# Patient Record
Sex: Female | Born: 1950 | Race: White | Hispanic: No | Marital: Single | Smoking: Current every day smoker
Health system: Southern US, Community
[De-identification: ages and names within clinical notes are randomized; demographics above are authoritative.]

## PROBLEM LIST (undated history)

## (undated) DIAGNOSIS — I4729 Other ventricular tachycardia: Secondary | ICD-10-CM

## (undated) DIAGNOSIS — I2510 Atherosclerotic heart disease of native coronary artery without angina pectoris: Secondary | ICD-10-CM

## (undated) DIAGNOSIS — I82409 Acute embolism and thrombosis of unspecified deep veins of unspecified lower extremity: Secondary | ICD-10-CM

## (undated) HISTORY — PX: ABDOMINAL HYSTERECTOMY: SHX81

## (undated) HISTORY — DX: Other ventricular tachycardia: I47.29

## (undated) HISTORY — PX: CARDIAC CATHETERIZATION: SHX172

## (undated) HISTORY — DX: Atherosclerotic heart disease of native coronary artery without angina pectoris: I25.10

---
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
## 1999-05-14 ENCOUNTER — Other Ambulatory Visit: Admission: RE | Admit: 1999-05-14 | Discharge: 1999-05-14 | Payer: Self-pay | Admitting: Gynecology

## 2002-07-04 ENCOUNTER — Encounter: Payer: Self-pay | Admitting: Emergency Medicine

## 2002-07-04 ENCOUNTER — Emergency Department (HOSPITAL_COMMUNITY): Admission: EM | Admit: 2002-07-04 | Discharge: 2002-07-04 | Payer: Self-pay | Admitting: Emergency Medicine

## 2010-07-28 ENCOUNTER — Ambulatory Visit: Payer: Self-pay | Admitting: Internal Medicine

## 2012-03-09 ENCOUNTER — Encounter (HOSPITAL_COMMUNITY): Payer: Self-pay | Admitting: Emergency Medicine

## 2012-03-09 ENCOUNTER — Emergency Department (HOSPITAL_COMMUNITY): Payer: Self-pay

## 2012-03-09 ENCOUNTER — Emergency Department (HOSPITAL_COMMUNITY)
Admission: EM | Admit: 2012-03-09 | Discharge: 2012-03-09 | Disposition: A | Discharge status: Home / Self Care | Payer: Self-pay | Attending: Emergency Medicine | Admitting: Emergency Medicine

## 2012-03-09 DIAGNOSIS — S82209A Unspecified fracture of shaft of unspecified tibia, initial encounter for closed fracture: Secondary | ICD-10-CM | POA: Insufficient documentation

## 2012-03-09 DIAGNOSIS — Y929 Unspecified place or not applicable: Secondary | ICD-10-CM | POA: Insufficient documentation

## 2012-03-09 DIAGNOSIS — W64XXXA Exposure to other animate mechanical forces, initial encounter: Secondary | ICD-10-CM | POA: Insufficient documentation

## 2012-03-09 DIAGNOSIS — Y939 Activity, unspecified: Secondary | ICD-10-CM | POA: Insufficient documentation

## 2012-03-09 NOTE — ED Notes (Signed)
PT. REPORTS LEFT KNEE PAIN ONSET THIS AFTERNOON , STATES HIT BY A DOG THIS AFTERNOON , PAIN WITH MOVEMENT , AMBULATORY.

## 2012-03-09 NOTE — ED Notes (Signed)
Pt states a dog ran into her and hurt her left knee. Pt sates she lowered herself to ground as "it hurt so bad." NAd noted. Pt ambulatory to room with quick steady gait. Pain moves around lower leg, from front of knee, to back of knee, to shin and then to calf. No deformity or swelling noted.

## 2012-03-09 NOTE — Progress Notes (Signed)
Orthopedic Tech Progress Note Patient Details:  Leslie Cross [birth year] [id number]  Ortho Devices Type of Ortho Device: Crutches;Knee Immobilizer Ortho Device/Splint Location: (L) LE Ortho Device/Splint Interventions: Application   Jennye Moccasin 03/09/2012, 10:09 PM

## 2012-03-09 NOTE — ED Provider Notes (Signed)
History   This chart was scribed for non-physician practitioner working with Richardean Canal, MD by Bennett Scrape, ED Scribe. This patient was seen in room TR10C/TR10C and the patient's care was started at 21:33.  CSN: [id number]  Arrival date & time 03/09/12  1919   First MD Initiated Contact with Patient 03/09/12 2037      Chief Complaint  Patient presents with  . Knee Pain    HPI  Leslie Cross is a 62 y.o. female who presents to the Emergency Department complaining of 3 hours of new, constant, gradually improving, left knee pain after injury. Pain is 3/10, worse with ambulation, alleviated with elevation. Pt reports being hit in the left knee by a large dog. Ambulatory after event. No fall, LOC, cephalic injury, hip pain, or back pain. She states she has mild relief after taking 3 ibuprofen. Pt denies use of tobacco products, consumption of alcohol, and use of illicit drugs. No pertinent medical or surgical Hx denoted.    History reviewed. No pertinent past medical history.  History reviewed. No pertinent past surgical history.  No family history on file.  History  Substance Use Topics  . Smoking status: Never Smoker   . Smokeless tobacco: Not on file  . Alcohol Use: No    Review of Systems  Musculoskeletal: Negative for back pain and gait problem.  Skin: Negative for wound.  Neurological: Negative for headaches.  All other systems reviewed and are negative.    Allergies  Cephalexin; Erythromycin base; Other; and Penicillins  Home Medications   Current Outpatient Rx  Name  Route  Sig  Dispense  Refill  . IBU PO   Oral   Take 3 tablets by mouth once.           BP 128/92  Pulse 84  Temp 98.1 F (36.7 C) (Oral)  Resp 14  SpO2 99%  Physical Exam  Nursing note and vitals reviewed. Constitutional: She is oriented to person, place, and time. She appears well-developed and well-nourished. No distress.  HENT:  Head: Normocephalic and atraumatic.  Eyes:  Conjunctivae normal and EOM are normal.  Neck: Normal range of motion. Neck supple. No tracheal deviation present.  Cardiovascular: Normal rate.   Pulmonary/Chest: Effort normal. No respiratory distress.  Abdominal: She exhibits no distension.  Musculoskeletal: Normal range of motion.       Left knee joint line tender to palpation, ROM deferred 2/2 pain, mild knee swelling.  Neurological: She is alert and oriented to person, place, and time. No sensory deficit.  Skin: Skin is dry.  Psychiatric: She has a normal mood and affect. Her behavior is normal. Judgment and thought content normal.    ED Course  Procedures DIAGNOSTIC STUDIES: Oxygen Saturation is 99% on room air, normal by my interpretation.    COORDINATION OF CARE: 21:33- Evaluated Pt. Pt is awake, alert, and without distress. 21:39- Patient  understand and agree with initial ED impression and plan with expectations set for ED visit.     Labs Reviewed - No data to display Dg Knee Complete 4 Views Left  03/09/2012  *RADIOLOGY REPORT*  Clinical Data: Blow to the lateral aspect of the knee.  Pain.  LEFT KNEE - COMPLETE 4+ VIEW  Comparison: None.  Findings: The patient has a minimal depressed lateral tibial plateau fracture.  No other acute bony or joint abnormality is identified.  Small joint effusion is noted.  IMPRESSION: Minimally depressed lateral tibial plateau fracture.   Original Report Authenticated By: Maisie Fus  Maricela Curet, M.D.      1. Tibia fracture       MDM  62 year old female with the tibia fracture. Will place the patient in a long-leg splint, give the patient crutches, and have her followup with orthopedics. The patient does not want any strong pain medicine. Encouraged patient to continue taking ibuprofen and to use ice on the affected area. Encouraged patient to use rice therapy. Will give the patient a work note.     I personally performed the services described in this documentation, which was scribed in  my presence. The recorded information has been reviewed and is accurate.     Roxy Horseman, PA-C 03/09/12 2147

## 2012-03-09 NOTE — ED Provider Notes (Signed)
Medical screening examination/treatment/procedure(s) were performed by non-physician practitioner and as supervising physician I was immediately available for consultation/collaboration.   David H Yao, MD 03/09/12 2350 

## 2012-03-13 ENCOUNTER — Other Ambulatory Visit: Payer: Self-pay | Admitting: Sports Medicine

## 2012-03-13 ENCOUNTER — Ambulatory Visit
Admission: RE | Admit: 2012-03-13 | Discharge: 2012-03-13 | Disposition: A | Discharge status: Home / Self Care | Payer: No Typology Code available for payment source | Source: Ambulatory Visit | Attending: Sports Medicine | Admitting: Sports Medicine

## 2012-03-13 DIAGNOSIS — S82143A Displaced bicondylar fracture of unspecified tibia, initial encounter for closed fracture: Secondary | ICD-10-CM

## 2018-08-25 ENCOUNTER — Emergency Department (HOSPITAL_COMMUNITY): Payer: Self-pay

## 2018-08-25 ENCOUNTER — Other Ambulatory Visit: Payer: Self-pay

## 2018-08-25 ENCOUNTER — Observation Stay (HOSPITAL_COMMUNITY)
Admission: EM | Admit: 2018-08-25 | Discharge: 2018-08-25 | Disposition: A | Discharge status: Home / Self Care | Payer: Self-pay | Attending: Internal Medicine | Admitting: Internal Medicine

## 2018-08-25 ENCOUNTER — Encounter (HOSPITAL_COMMUNITY): Payer: Self-pay

## 2018-08-25 ENCOUNTER — Emergency Department (HOSPITAL_BASED_OUTPATIENT_CLINIC_OR_DEPARTMENT_OTHER): Payer: Self-pay

## 2018-08-25 ENCOUNTER — Observation Stay (HOSPITAL_BASED_OUTPATIENT_CLINIC_OR_DEPARTMENT_OTHER): Payer: Self-pay

## 2018-08-25 DIAGNOSIS — F17210 Nicotine dependence, cigarettes, uncomplicated: Secondary | ICD-10-CM | POA: Insufficient documentation

## 2018-08-25 DIAGNOSIS — Z880 Allergy status to penicillin: Secondary | ICD-10-CM | POA: Insufficient documentation

## 2018-08-25 DIAGNOSIS — I82401 Acute embolism and thrombosis of unspecified deep veins of right lower extremity: Secondary | ICD-10-CM | POA: Diagnosis present

## 2018-08-25 DIAGNOSIS — I340 Nonrheumatic mitral (valve) insufficiency: Secondary | ICD-10-CM

## 2018-08-25 DIAGNOSIS — Z881 Allergy status to other antibiotic agents status: Secondary | ICD-10-CM | POA: Insufficient documentation

## 2018-08-25 DIAGNOSIS — I82431 Acute embolism and thrombosis of right popliteal vein: Secondary | ICD-10-CM | POA: Insufficient documentation

## 2018-08-25 DIAGNOSIS — I2694 Multiple subsegmental pulmonary emboli without acute cor pulmonale: Principal | ICD-10-CM | POA: Insufficient documentation

## 2018-08-25 DIAGNOSIS — Z86718 Personal history of other venous thrombosis and embolism: Secondary | ICD-10-CM

## 2018-08-25 DIAGNOSIS — Z1159 Encounter for screening for other viral diseases: Secondary | ICD-10-CM | POA: Insufficient documentation

## 2018-08-25 DIAGNOSIS — Z7982 Long term (current) use of aspirin: Secondary | ICD-10-CM | POA: Insufficient documentation

## 2018-08-25 DIAGNOSIS — I2699 Other pulmonary embolism without acute cor pulmonale: Secondary | ICD-10-CM

## 2018-08-25 DIAGNOSIS — M7989 Other specified soft tissue disorders: Secondary | ICD-10-CM

## 2018-08-25 DIAGNOSIS — I361 Nonrheumatic tricuspid (valve) insufficiency: Secondary | ICD-10-CM

## 2018-08-25 DIAGNOSIS — R609 Edema, unspecified: Secondary | ICD-10-CM

## 2018-08-25 DIAGNOSIS — I82461 Acute embolism and thrombosis of right calf muscular vein: Secondary | ICD-10-CM

## 2018-08-25 DIAGNOSIS — Z86711 Personal history of pulmonary embolism: Secondary | ICD-10-CM

## 2018-08-25 DIAGNOSIS — I82441 Acute embolism and thrombosis of right tibial vein: Secondary | ICD-10-CM

## 2018-08-25 HISTORY — DX: Acute embolism and thrombosis of unspecified deep veins of right lower extremity: I82.401

## 2018-08-25 HISTORY — DX: Other pulmonary embolism without acute cor pulmonale: I26.99

## 2018-08-25 LAB — CBC WITH DIFFERENTIAL/PLATELET
Abs Immature Granulocytes: 0.02 10*3/uL (ref 0.00–0.07)
Basophils Absolute: 0 10*3/uL (ref 0.0–0.1)
Basophils Relative: 1 %
Eosinophils Absolute: 0.1 10*3/uL (ref 0.0–0.5)
Eosinophils Relative: 1 %
HCT: 34.3 % — ABNORMAL LOW (ref 36.0–46.0)
Hemoglobin: 11.5 g/dL — ABNORMAL LOW (ref 12.0–15.0)
Immature Granulocytes: 0 %
Lymphocytes Relative: 18 %
Lymphs Abs: 1.4 10*3/uL (ref 0.7–4.0)
MCH: 34.4 pg — ABNORMAL HIGH (ref 26.0–34.0)
MCHC: 33.5 g/dL (ref 30.0–36.0)
MCV: 102.7 fL — ABNORMAL HIGH (ref 80.0–100.0)
Monocytes Absolute: 0.5 10*3/uL (ref 0.1–1.0)
Monocytes Relative: 6 %
Neutro Abs: 5.6 10*3/uL (ref 1.7–7.7)
Neutrophils Relative %: 74 %
Platelets: 279 10*3/uL (ref 150–400)
RBC: 3.34 MIL/uL — ABNORMAL LOW (ref 3.87–5.11)
RDW: 13.7 % (ref 11.5–15.5)
WBC: 7.6 10*3/uL (ref 4.0–10.5)
nRBC: 0 % (ref 0.0–0.2)

## 2018-08-25 LAB — BASIC METABOLIC PANEL
Anion gap: 10 (ref 5–15)
BUN: 7 mg/dL — ABNORMAL LOW (ref 8–23)
CO2: 26 mmol/L (ref 22–32)
Calcium: 8.9 mg/dL (ref 8.9–10.3)
Chloride: 105 mmol/L (ref 98–111)
Creatinine, Ser: 0.83 mg/dL (ref 0.44–1.00)
GFR calc Af Amer: >60 mL/min (ref >60)
GFR calc non Af Amer: >60 mL/min (ref >60)
Glucose, Bld: 93 mg/dL (ref 70–99)
Potassium: 3.8 mmol/L (ref 3.5–5.1)
Sodium: 141 mmol/L (ref 135–145)

## 2018-08-25 LAB — HEPARIN LEVEL (UNFRACTIONATED): Heparin Unfractionated: 0.28 IU/mL — ABNORMAL LOW (ref 0.30–0.70)

## 2018-08-25 LAB — ECHOCARDIOGRAM COMPLETE
Height: 62 in
Weight: 1568 oz

## 2018-08-25 LAB — BRAIN NATRIURETIC PEPTIDE: B Natriuretic Peptide: 96.1 pg/mL (ref 0.0–100.0)

## 2018-08-25 LAB — ANTITHROMBIN III: AntiThromb III Func: 108 % (ref 75–120)

## 2018-08-25 LAB — TROPONIN I (HIGH SENSITIVITY)
Troponin I (High Sensitivity): 6 ng/L (ref <18)
Troponin I (High Sensitivity): 11 ng/L (ref <18)

## 2018-08-25 MED ORDER — SENNOSIDES-DOCUSATE SODIUM 8.6-50 MG PO TABS: 1.0000 | ORAL_TABLET | Freq: Every evening | ORAL | Status: DC | PRN

## 2018-08-25 MED ORDER — HEPARIN (PORCINE) 25000 UT/250ML-% IV SOLN
750.0000 [IU]/h | INTRAVENOUS | Status: DC
  Administered 2018-08-25: 750 [IU]/h via INTRAVENOUS
  Filled 2018-08-25: qty 250

## 2018-08-25 MED ORDER — PROMETHAZINE HCL 25 MG PO TABS: 12.5000 mg | ORAL_TABLET | Freq: Four times a day (QID) | ORAL | Status: DC | PRN

## 2018-08-25 MED ORDER — HEPARIN BOLUS VIA INFUSION
2700.0000 [IU] | Freq: Once | INTRAVENOUS | Status: AC
  Administered 2018-08-25: 2700 [IU] via INTRAVENOUS
  Filled 2018-08-25: qty 2700

## 2018-08-25 MED ORDER — ACETAMINOPHEN 325 MG PO TABS: 650.0000 mg | ORAL_TABLET | Freq: Four times a day (QID) | ORAL | Status: DC | PRN

## 2018-08-25 MED ORDER — ACETAMINOPHEN 650 MG RE SUPP: 650.0000 mg | Freq: Four times a day (QID) | RECTAL | Status: DC | PRN

## 2018-08-25 MED ORDER — ELIQUIS 5 MG VTE STARTER PACK: ORAL_TABLET | ORAL | 0 refills | Status: DC

## 2018-08-25 MED ORDER — IOHEXOL 350 MG/ML SOLN
69.0000 mL | Freq: Once | INTRAVENOUS | Status: AC | PRN
  Administered 2018-08-25: 69 mL via INTRAVENOUS

## 2018-08-25 MED FILL — ELIQUIS STARTER PACK 5 MG T: 5 | 30 days supply | Qty: 74 | Fill #0

## 2018-08-25 NOTE — H&P (Addendum)
Date: 08/25/2018               Patient Name:  Leslie Cross MRN: [id number]  DOB: 1950/06/26 Age / Sex: 68 y.o., female   PCP: Patient, No Pcp Per         Medical Service: Internal Medicine Teaching Service         Attending Physician: Dr. Rebeca Alert, Raynaldo Opitz, MD    First Contact: Dr. Court Joy Pager: [phone]  Second Contact: Dr. 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 The patient stated that the pain started yesterday and progressively worsened throughout the day. She noted some accompanied shortness of breath. Denies dizziness, chest pain, nausea, vomiting, abdominal pain.  Patient states that she is a fairly active person (works currently and also was able to do all her daily ADLs), has not had any recent trauma or surgeries, and she has not had any long car/plane rides.  Of note, the patient states that she had either a pulmonary embolus or DVT in the 1980s.  She does not recall where the clot was.  She states that she was treated with heparin in the hospital and warfarin for 3 months and then told to stop anticoagulation.  In the ED, the patient is saturating 100% on room air, hemodynamically stable, rr ranging 16-20, pulse 70-80s, and afebrile. Was started on heparin.   Meds:  Current Meds  Medication Sig   aspirin 325 MG tablet Take 325 mg by mouth once.   calcium-vitamin D (OSCAL WITH D) 500-200 MG-UNIT tablet Take 1 tablet by mouth at bedtime.   vitamin C (ASCORBIC ACID) 500 MG tablet Take 500 mg by mouth at bedtime.   vitamin E 400 UNIT capsule Take 400 Units by mouth at bedtime.     Allergies: Allergies as of 08/25/2018 - Review Complete 08/25/2018    Allergen Reaction Noted   Cephalexin Anaphylaxis 03/09/2012   Erythromycin base Anaphylaxis 03/09/2012   Other Other (See Comments) 03/09/2012   Penicillins Other (See Comments) 03/09/2012   No past medical history on file.  Family History:   Her sons both have had DVT/PE Her daughter died of Ewing sarcoma at age 6  Social History:  Lives alone in [place] Works at [employer] in [employer] Smokes half pack per day for the past 30 years Does not drink alcohol or use drugs   Review of Systems: A complete ROS was negative except as per HPI.   Physical Exam: Blood pressure 111/66, pulse 76, temperature 98.5 F (36.9 C), temperature source Oral, resp. rate 16, height 5\' 2"  (1.575 m), weight 44.5 kg, SpO2 100 %.  Physical Exam  Constitutional: She is oriented to person, place, and time. She appears well-developed and well-nourished. No distress.  HENT:  Head: Normocephalic and atraumatic.  Eyes: Conjunctivae are normal.  Cardiovascular: Normal rate, regular rhythm and normal heart sounds.  Respiratory: Effort normal and breath sounds normal. No respiratory distress. She has no wheezes.  GI: Soft. Bowel sounds are normal. She exhibits no distension. There is no abdominal tenderness.  Musculoskeletal:        General: Edema present.  Comments: Right calf swollen in comparison to left calf  Neurological: She is alert and oriented to person, place, and time.  Skin: She is not diaphoretic. No erythema.  Psychiatric: She has a normal mood and affect. Her behavior is normal. Judgment and thought content normal.   Labs: CBC-hemoglobin 11, hematocrit 34, MCV 102, platelets 279 BMP-na 141, k 3.8, co2 26, cr 0.83 BNP 96 Troponin 6 COVID19 pending   EKG: personally reviewed my interpretation is no st or t wave changes, sinus rhythm.  Chest x-ray 08/25/2018  Two prominent nodularities in the left middle and right lower lobe.  No areas of consolidation or effusions. Official read  by radiology suggested concern for possible infiltrative neoplasm.   CTA chest 08/25/2018 Bilateral pulmonary emboli there is extensive including occlusive thrombus in central pulmonary artery branch to the anterior segment of the left upper lobe along with associated pulmonary infarcts in the right upper lobe and both lower lobes.  CT evidence of right heart strain.  Asymmetric prominence of right thyroid lobe that is presumably a goiter.  Right lower extremity Doppler 08/25/2018 DVT in the right popliteal vein, right posterior tibial veins gastrocnemius vein  Assessment & Plan by Problem: Active Problems:   Pulmonary infarct [facility])  Leslie Cross is a 69 year old female with no known past medical history other than one episode of PE or DVT in 1980s presented with right lower extremity swelling and pain. Found to have bilateral upper lobe PE with pulmonary infarcts.   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  Patient states that both of her children all have also had clots.  It is possible that she may have a clotting disorder that will need to be worked up.  This is the second time patient has had a thrombotic event she will most likely need anticoagulation lifelong.   Patient has a low risk pesi score of 67.  Will keep patient overnight to monitor.   -started IV heparin, will need to be changed to NOAC tomorrow 7/11 -Pending TTE to assess right heart strain -Hypercoagulability work up: pending cardiolipin, prothrombin, factor V leiden, beta 2 glycoprotein, lupus, protein s, protein c, antithrombin III -continuous pulse oximetry and need for supplemental oxygen -consulted case management for medication assistance and to find pcp close to patient's residence  Dispo: Admit patient to Observation with expected length of stay less than 2 midnights.  SignedLars Mage, MD 08/25/2018, 12:30 PM  Pager: Pager: [phone]

## 2018-08-25 NOTE — ED Triage Notes (Signed)
Right leg swelling that began yesterday, she reports some of the swelling has come down, she also states she has had blood clots in the past and her children have both had them as well.  She took aspirin and and elevated bed.  She reports being very active individual.

## 2018-08-25 NOTE — ED Provider Notes (Signed)
[facility] EMERGENCY DEPARTMENT Provider Note   CSN: [id number] Arrival date & time: 08/25/18  2992    History   Chief Complaint Chief Complaint  Patient presents with   Leg Pain    HPI Leslie Cross is a 68 y.o. female.     Pt presents to the ED today with right leg pain.  The pt has a hx of DVT, but not for several years (late 25s).  She was on coumadin for 3 months only.  She noticed right leg pain and swelling yesterday.  She also has some sob.  No f/c.  No n/v.     No past medical history on file.  There are no active problems to display for this patient.   No past surgical history on file.   OB History   No obstetric history on file.      Home Medications    Prior to Admission medications   Medication Sig Start Date End Date Taking? Authorizing Provider  aspirin 325 MG tablet Take 325 mg by mouth once.   Yes [provider]  calcium-vitamin D (OSCAL WITH D) 500-200 MG-UNIT tablet Take 1 tablet by mouth at bedtime.   Yes [provider]  vitamin C (ASCORBIC ACID) 500 MG tablet Take 500 mg by mouth at bedtime.   Yes [provider]  vitamin E 400 UNIT capsule Take 400 Units by mouth at bedtime.   Yes [provider]    Family History No family history on file.  Social History Social History   Tobacco Use   Smoking status: Never Smoker  Substance Use Topics   Alcohol use: No   Drug use: No   Breeds bull mastiffs  Allergies   Cephalexin, Erythromycin base, Other, and Penicillins   Review of Systems Review of Systems  Respiratory: Positive for shortness of breath.   Musculoskeletal:       Right leg pain and swelling  All other systems reviewed and are negative.    Physical Exam Updated Vital Signs BP 111/66    Pulse 76    Temp 98.5 F (36.9 C) (Oral)    Resp 16    Ht 5\' 2"  (1.575 m)    Wt 44.5 kg    SpO2 100%    BMI 17.92 kg/m   Physical Exam Vitals signs and nursing note  reviewed.  Constitutional:      Appearance: Normal appearance.  HENT:     Head: Normocephalic and atraumatic.     Right Ear: External ear normal.     Left Ear: External ear normal.     Nose: Nose normal.     Mouth/Throat:     Mouth: Mucous membranes are moist.     Pharynx: Oropharynx is clear.  Eyes:     Extraocular Movements: Extraocular movements intact.     Conjunctiva/sclera: Conjunctivae normal.     Pupils: Pupils are equal, round, and reactive to light.  Neck:     Musculoskeletal: Normal range of motion and neck supple.  Cardiovascular:     Rate and Rhythm: Normal rate and regular rhythm.     Pulses: Normal pulses.     Heart sounds: Normal heart sounds.  Pulmonary:     Effort: Pulmonary effort is normal.     Breath sounds: Normal breath sounds.  Abdominal:     General: Abdomen is flat. Bowel sounds are normal.     Palpations: Abdomen is soft.  Musculoskeletal: Normal range of motion.  Comments: RLE swelling  Skin:    General: Skin is warm.     Capillary Refill: Capillary refill takes less than 2 seconds.  Neurological:     General: No focal deficit present.     Mental Status: She is alert and oriented to person, place, and time.  Psychiatric:        Mood and Affect: Mood normal.        Behavior: Behavior normal.        Thought Content: Thought content normal.        Judgment: Judgment normal.      ED Treatments / Results  Labs (all labs ordered are listed, but only abnormal results are displayed) Labs Reviewed  BASIC METABOLIC PANEL - Abnormal; Notable for the following components:      Result Value   BUN 7 (*)    All other components within normal limits  CBC WITH DIFFERENTIAL/PLATELET - Abnormal; Notable for the following components:   RBC 3.34 (*)    Hemoglobin 11.5 (*)    HCT 34.3 (*)    MCV 102.7 (*)    MCH 34.4 (*)    All other components within normal limits  NOVEL CORONAVIRUS, NAA (HOSPITAL ORDER, SEND-OUT TO REF LAB)  BRAIN NATRIURETIC  PEPTIDE  HEPARIN LEVEL (UNFRACTIONATED)  TROPONIN I (HIGH SENSITIVITY)  TROPONIN I (HIGH SENSITIVITY)    EKG EKG Interpretation  Date/Time:  Friday August 25 2018 07:52:31 EDT Ventricular Rate:  76 PR Interval:    QRS Duration: 90 QT Interval:  413 QTC Calculation: 465 R Axis:   60 Text Interpretation:  Sinus rhythm Confirmed by Isla Pence [phone]) on 08/25/2018 7:56:56 AM Also confirmed by Isla Pence [phone]), editor Philomena Doheny [id number])  on 08/25/2018 8:14:02 AM   Radiology Ct Angio Chest Pe W And/or Wo Contrast  Result Date: 08/25/2018 CLINICAL DATA:  History of blood clots with new onset leg pain. PE suspected, high pretest probability. EXAM: CT ANGIOGRAPHY CHEST WITH CONTRAST TECHNIQUE: Multidetector CT imaging of the chest was performed using the standard protocol during bolus administration of intravenous contrast. Multiplanar CT image reconstructions and MIPs were obtained to evaluate the vascular anatomy. CONTRAST:  61mL OMNIPAQUE IOHEXOL 350 MG/ML SOLN COMPARISON:  None. FINDINGS: Cardiovascular: Acute pulmonary emboli are seen extending from the RIGHT intralobar pulmonary artery into multiple segmental and subsegmental pulmonary artery branches to the RIGHT lower lobe, RIGHT middle lobe and RIGHT upper lobe, some nearly occlusive. Additional partially occlusive pulmonary emboli w are seen within the central segmental pulmonary artery branches to the LEFT lower lobe, lingula and LEFT upper lobe, including fairly extensive occlusive thrombus within the pulmonary branches to thew anterior segment of the LEFT upper lobe. Heart size is normal. No pericardial effusion. Thoracic aorta is normal in caliber and configuration. Mediastinum/Nodes: No mass or enlarged lymph nodes seen within the mediastinum or perihilar regions. Asymmetric prominence of the RIGHT thyroid lobe, presumably goiter. Esophagus is unremarkable. Trachea and central bronchi are unremarkable. Lungs/Pleura: Peripheral  wedge-shaped consolidation within the inferior segment of the RIGHT upper lobe, lateral aspect, consistent with pulmonary infarction. Additional rounded consolidations within the periphery of the RIGHT lower lobe and LEFT lower lobe, pleural based, suspicious for additional pulmonary infarcts. No pleural effusion or pneumothorax. Upper Abdomen: Limited images of the upper abdomen are unremarkable. Musculoskeletal: No acute or suspicious osseous finding. Review of the MIP images confirms the above findings. IMPRESSION: 1. Bilateral pulmonary emboli, fairly extensive bilaterally including occlusive thrombus within the central pulmonary artery branch to the  anterior segment of the LEFT upper lobe, with associated pulmonary infarcts within the RIGHT upper lobe and both lower lobes. Recommend follow-up chest CT at some point to ensure resolution of the presumed infarcts and exclude the less likely possibility of underlying neoplastic process. 2. No CT evidence of RIGHT heart strain. No pleural effusion or pulmonary edema. 3. Asymmetric prominence of the RIGHT thyroid lobe, presumably goiter. Consider nonemergent follow-up with thyroid ultrasound and/or nuclear medicine thyroid scan. Critical Value/emergent results and recommendations were called by telephone at the time of interpretation on 08/25/2018 at 10:19 am to Dr. Isla Pence , who verbally acknowledged these results. Electronically Signed   By: Franki Cabot M.D.   On: 08/25/2018 10:20   Dg Chest Port 1 View  Result Date: 08/25/2018 CLINICAL DATA:  Shortness of breath. Right leg swelling. EXAM: PORTABLE CHEST 1 VIEW COMPARISON:  None. FINDINGS: The heart size and mediastinal contours are within normal limits. Right upper lobe scarring noted two focal ill-defined nodular densities are seen in the left mid lung and lateral right lung base, which could represent foci of infiltrate or neoplasm. No evidence of pulmonary consolidation or edema. No evidence of  pleural effusion. IMPRESSION: Two ill-defined nodular densities in left mid lung and lateral right lung base. Differential diagnosis includes infiltrative neoplasm. Recommend chest CT without contrast for further evaluation. Electronically Signed   By: Marlaine Hind M.D.   On: 08/25/2018 08:15   Vas [place] Lower Extremity Venous (dvt) (only Mc & Wl 7a-7p)  Result Date: 08/25/2018  Lower Venous Study Indications: Swelling, and Edema.  Comparison Study: No prior Performing Technologist: Abram Sander RVS  Examination Guidelines: A complete evaluation includes B-mode imaging, spectral Doppler, color Doppler, and power Doppler as needed of all accessible portions of each vessel. Bilateral testing is considered an integral part of a complete examination. Limited examinations for reoccurring indications may be performed as noted.  +---------+---------------+---------+-----------+----------+-------+  RIGHT     Compressibility Phasicity Spontaneity Properties Summary  +---------+---------------+---------+-----------+----------+-------+  CFV       Full            Yes       Yes                             +---------+---------------+---------+-----------+----------+-------+  SFJ       Full                                                      +---------+---------------+---------+-----------+----------+-------+  FV Prox   Full                                                      +---------+---------------+---------+-----------+----------+-------+  FV Mid    Full                                                      +---------+---------------+---------+-----------+----------+-------+  FV Distal Full                                                      +---------+---------------+---------+-----------+----------+-------+  PFV       Full                                                      +---------+---------------+---------+-----------+----------+-------+  POP       None            No        No                     Acute     +---------+---------------+---------+-----------+----------+-------+  PTV       None                                             Acute    +---------+---------------+---------+-----------+----------+-------+  PERO      None                                             Acute    +---------+---------------+---------+-----------+----------+-------+  Gastroc   None                                             Acute    +---------+---------------+---------+-----------+----------+-------+   +----+---------------+---------+-----------+----------+-------+  LEFT Compressibility Phasicity Spontaneity Properties Summary  +----+---------------+---------+-----------+----------+-------+  CFV  Full            Yes       Yes                             +----+---------------+---------+-----------+----------+-------+     Summary: Right: Findings consistent with acute deep vein thrombosis involving the right popliteal vein, right posterior tibial veins, right peroneal veins, and right gastrocnemius veins. No cystic structure found in the popliteal fossa. Left: No evidence of common femoral vein obstruction.  *See table(s) above for measurements and observations.    Preliminary     Procedures Procedures (including critical care time)  Medications Ordered in ED Medications  heparin ADULT infusion 100 units/mL (25000 units/255mL sodium chloride 0.45%) (750 Units/hr Intravenous New Bag/Given 08/25/18 1126)  iohexol (OMNIPAQUE) 350 MG/ML injection 69 mL (69 mLs Intravenous Contrast Given 08/25/18 1001)  heparin bolus via infusion 2,700 Units (2,700 Units Intravenous Bolus from Bag 08/25/18 1126)     Initial Impression / Assessment and Plan / ED Course  I have reviewed the triage vital signs and the nursing notes.  Pertinent labs & imaging results that were available during my care of the patient were reviewed by me and considered in my medical decision making (see chart for details).       Pt started on heparin for DVT and PE.   She is not hypoxic, but has multiple pulmonary infarcts.  She has no outpatient f/u.  She was d/w IMTS for admission.    Final Clinical Impressions(s) / ED Diagnoses   Final diagnoses:  Multiple subsegmental pulmonary emboli without acute cor pulmonale  Pulmonary infarct (HCC)  Acute deep vein thrombosis (DVT) of popliteal vein of  right lower extremity [facility])    ED Discharge Orders    None       Isla Pence, MD 08/25/18 1136

## 2018-08-25 NOTE — ED Notes (Signed)
ED TO INPATIENT HANDOFF REPORT  ED Nurse Name and Phone #: Benjamine Mola [phone]  S Name/Age/Gender Leslie Cross 68 y.o. female Room/Bed: 057C/057C  Code Status   Code Status: DNR  Home/SNF/Other Home Patient oriented to: self, place, time and situation Is this baseline? Yes   Triage Complete: Triage complete  Chief Complaint Blood Clots  Triage Note Right leg swelling that began yesterday, she reports some of the swelling has come down, she also states she has had blood clots in the past and her children have both had them as well.  She took aspirin and and elevated bed.  She reports being very active individual.   Allergies Allergies  Allergen Reactions  . Cephalexin Anaphylaxis  . Erythromycin Base Anaphylaxis  . Other Other (See Comments)    All Cillins, Mycins  No meat  . Penicillins Other (See Comments)    Did it involve swelling of the face/tongue/throat, SOB, or low BP? Unknown Did it involve sudden or severe rash/hives, skin peeling, or any reaction on the inside of your mouth or nose? Unknown Did you need to seek medical attention at a hospital or doctor's office? Unknown When did it last happen?10 years If all above answers are "NO", may proceed with cephalosporin use.    Level of Care/Admitting Diagnosis ED Disposition    ED Disposition Condition [facility] Area: [place] [100100]  Level of Care: Telemetry Medical [104]  Covid Evaluation: Asymptomatic Screening Protocol (No Symptoms)  Diagnosis: Pulmonary infarct [facility]) [846962]  Admitting Physician: Oda Kilts [[id number]]  Attending Physician: Oda Kilts [phone]  PT Class (Do Not Modify): Observation [104]  PT Acc Code (Do Not Modify): Observation [10022]       B Medical/Surgery History No past medical history on file. No past surgical history on file.   A IV Location/Drains/Wounds Patient Lines/Drains/Airways Status   Active  Line/Drains/Airways    Name:   Placement date:   Placement time:   Site:   Days:   Peripheral IV 08/25/18 Left Antecubital   08/25/18    0857    Antecubital   less than 1          Intake/Output Last 24 hours No intake or output data in the 24 hours ending 08/25/18 1228  Labs/Imaging Results for orders placed or performed during the hospital encounter of 08/25/18 (from the past 48 hour(s))  Brain natriuretic peptide     Status: None   Collection Time: 08/25/18  7:49 AM  Result Value Ref Range   B Natriuretic Peptide 96.1 0.0 - 100.0 pg/mL    Comment: Performed at [facility] [address] [place], [place] [address]  Basic metabolic panel     Status: Abnormal   Collection Time: 08/25/18  8:05 AM  Result Value Ref Range   Sodium 141 135 - 145 mmol/L   Potassium 3.8 3.5 - 5.1 mmol/L   Chloride 105 98 - 111 mmol/L   CO2 26 22 - 32 mmol/L   Glucose, Bld 93 70 - 99 mg/dL   BUN 7 (L) 8 - 23 mg/dL   Creatinine, Ser 0.83 0.44 - 1.00 mg/dL   Calcium 8.9 8.9 - 10.3 mg/dL   GFR calc non Af Amer >60 >60 mL/min   GFR calc Af Amer >60 >60 mL/min   Anion gap 10 5 - 15    Comment: Performed at [facility], [place] [address], [place], [place] [address]  CBC  with Differential     Status: Abnormal   Collection Time: 08/25/18  8:05 AM  Result Value Ref Range   WBC 7.6 4.0 - 10.5 K/uL   RBC 3.34 (L) 3.87 - 5.11 MIL/uL   Hemoglobin 11.5 (L) 12.0 - 15.0 g/dL   HCT 34.3 (L) 36.0 - 46.0 %   MCV 102.7 (H) 80.0 - 100.0 fL   MCH 34.4 (H) 26.0 - 34.0 pg   MCHC 33.5 30.0 - 36.0 g/dL   RDW 13.7 11.5 - 15.5 %   Platelets 279 150 - 400 K/uL   nRBC 0.0 0.0 - 0.2 %   Neutrophils Relative % 74 %   Neutro Abs 5.6 1.7 - 7.7 K/uL   Lymphocytes Relative 18 %   Lymphs Abs 1.4 0.7 - 4.0 K/uL   Monocytes Relative 6 %   Monocytes Absolute 0.5 0.1 - 1.0 K/uL   Eosinophils Relative 1 %   Eosinophils Absolute 0.1 0.0 - 0.5 K/uL   Basophils Relative 1 %   Basophils Absolute 0.0 0.0 - 0.1 K/uL    Immature Granulocytes 0 %   Abs Immature Granulocytes 0.02 0.00 - 0.07 K/uL    Comment: Performed at [facility], [address], [place], [place] [address]  Troponin I (High Sensitivity)     Status: None   Collection Time: 08/25/18  8:05 AM  Result Value Ref Range   Troponin I (High Sensitivity) 6 <18 ng/L    Comment: (NOTE) Elevated high sensitivity troponin I (hsTnI) values and significant  changes across serial measurements may suggest ACS but many other  chronic and acute conditions are known to elevate hsTnI results.  Refer to the "Links" section for chest pain algorithms and additional  guidance. Performed at [facility], [place] [address] [place], [place] [address]    Ct Angio Chest Pe W And/or Wo Contrast  Result Date: 08/25/2018 CLINICAL DATA:  History of blood clots with new onset leg pain. PE suspected, high pretest probability. EXAM: CT ANGIOGRAPHY CHEST WITH CONTRAST TECHNIQUE: Multidetector CT imaging of the chest was performed using the standard protocol during bolus administration of intravenous contrast. Multiplanar CT image reconstructions and MIPs were obtained to evaluate the vascular anatomy. CONTRAST:  47mL OMNIPAQUE IOHEXOL 350 MG/ML SOLN COMPARISON:  None. FINDINGS: Cardiovascular: Acute pulmonary emboli are seen extending from the RIGHT intralobar pulmonary artery into multiple segmental and subsegmental pulmonary artery branches to the RIGHT lower lobe, RIGHT middle lobe and RIGHT upper lobe, some nearly occlusive. Additional partially occlusive pulmonary emboli w are seen within the central segmental pulmonary artery branches to the LEFT lower lobe, lingula and LEFT upper lobe, including fairly extensive occlusive thrombus within the pulmonary branches to thew anterior segment of the LEFT upper lobe. Heart size is normal. No pericardial effusion. Thoracic aorta is normal in caliber and configuration. Mediastinum/Nodes: No mass or enlarged lymph nodes seen  within the mediastinum or perihilar regions. Asymmetric prominence of the RIGHT thyroid lobe, presumably goiter. Esophagus is unremarkable. Trachea and central bronchi are unremarkable. Lungs/Pleura: Peripheral wedge-shaped consolidation within the inferior segment of the RIGHT upper lobe, lateral aspect, consistent with pulmonary infarction. Additional rounded consolidations within the periphery of the RIGHT lower lobe and LEFT lower lobe, pleural based, suspicious for additional pulmonary infarcts. No pleural effusion or pneumothorax. Upper Abdomen: Limited images of the upper abdomen are unremarkable. Musculoskeletal: No acute or suspicious osseous finding. Review of the MIP images confirms the above findings. IMPRESSION: 1. Bilateral pulmonary emboli, fairly extensive  bilaterally including occlusive thrombus within the central pulmonary artery branch to the anterior segment of the LEFT upper lobe, with associated pulmonary infarcts within the RIGHT upper lobe and both lower lobes. Recommend follow-up chest CT at some point to ensure resolution of the presumed infarcts and exclude the less likely possibility of underlying neoplastic process. 2. No CT evidence of RIGHT heart strain. No pleural effusion or pulmonary edema. 3. Asymmetric prominence of the RIGHT thyroid lobe, presumably goiter. Consider nonemergent follow-up with thyroid ultrasound and/or nuclear medicine thyroid scan. Critical Value/emergent results and recommendations were called by telephone at the time of interpretation on 08/25/2018 at 10:19 am to Dr. Isla Pence , who verbally acknowledged these results. Electronically Signed   By: Franki Cabot M.D.   On: 08/25/2018 10:20   Dg Chest Port 1 View  Result Date: 08/25/2018 CLINICAL DATA:  Shortness of breath. Right leg swelling. EXAM: PORTABLE CHEST 1 VIEW COMPARISON:  None. FINDINGS: The heart size and mediastinal contours are within normal limits. Right upper lobe scarring noted two focal  ill-defined nodular densities are seen in the left mid lung and lateral right lung base, which could represent foci of infiltrate or neoplasm. No evidence of pulmonary consolidation or edema. No evidence of pleural effusion. IMPRESSION: Two ill-defined nodular densities in left mid lung and lateral right lung base. Differential diagnosis includes infiltrative neoplasm. Recommend chest CT without contrast for further evaluation. Electronically Signed   By: Marlaine Hind M.D.   On: 08/25/2018 08:15   Vas [place] Lower Extremity Venous (dvt) (only Mc & Wl 7a-7p)  Result Date: 08/25/2018  Lower Venous Study Indications: Swelling, and Edema.  Comparison Study: No prior Performing Technologist: Abram Sander RVS  Examination Guidelines: A complete evaluation includes B-mode imaging, spectral Doppler, color Doppler, and power Doppler as needed of all accessible portions of each vessel. Bilateral testing is considered an integral part of a complete examination. Limited examinations for reoccurring indications may be performed as noted.  +---------+---------------+---------+-----------+----------+-------+ RIGHT    CompressibilityPhasicitySpontaneityPropertiesSummary +---------+---------------+---------+-----------+----------+-------+ CFV      Full           Yes      Yes                          +---------+---------------+---------+-----------+----------+-------+ SFJ      Full                                                 +---------+---------------+---------+-----------+----------+-------+ FV Prox  Full                                                 +---------+---------------+---------+-----------+----------+-------+ FV Mid   Full                                                 +---------+---------------+---------+-----------+----------+-------+ FV DistalFull                                                 +---------+---------------+---------+-----------+----------+-------+  PFV       Full                                                 +---------+---------------+---------+-----------+----------+-------+ POP      None           No       No                   Acute   +---------+---------------+---------+-----------+----------+-------+ PTV      None                                         Acute   +---------+---------------+---------+-----------+----------+-------+ PERO     None                                         Acute   +---------+---------------+---------+-----------+----------+-------+ Gastroc  None                                         Acute   +---------+---------------+---------+-----------+----------+-------+   +----+---------------+---------+-----------+----------+-------+ LEFTCompressibilityPhasicitySpontaneityPropertiesSummary +----+---------------+---------+-----------+----------+-------+ CFV Full           Yes      Yes                          +----+---------------+---------+-----------+----------+-------+     Summary: Right: Findings consistent with acute deep vein thrombosis involving the right popliteal vein, right posterior tibial veins, right peroneal veins, and right gastrocnemius veins. No cystic structure found in the popliteal fossa. Left: No evidence of common femoral vein obstruction.  *See table(s) above for measurements and observations.    Preliminary     Pending Labs Unresulted Labs (From admission, onward)    Start     Ordered   08/26/18 0500  Heparin level (unfractionated)  Daily,   R     08/25/18 1039   08/26/18 0500  Comprehensive metabolic panel  Tomorrow morning,   R     08/25/18 1203   08/26/18 0500  CBC  Tomorrow morning,   R     08/25/18 1203   08/25/18 1700  Heparin level (unfractionated)  Once-Timed,   STAT     08/25/18 1039   08/25/18 1201  HIV antibody (Routine Testing)  Once,   STAT     08/25/18 1203   08/25/18 1111  Novel Coronavirus,NAA,(SEND-OUT TO REF LAB - TAT 24-48 hrs); Hosp Order   (Asymptomatic Patients Labs)  Once,   STAT    Question:  Rule Out  Answer:  Yes   08/25/18 1110          Vitals/Pain Today's Vitals   08/25/18 0815 08/25/18 0830 08/25/18 1124 08/25/18 1200  BP: 118/65 122/63 111/66   Pulse: 80 78 76   Resp: (!) 21 18 16    Temp:      TempSrc:      SpO2: 100% 100% 100%   Weight:      Height:      PainSc:    0-No pain    Isolation Precautions No  active isolations  Medications Medications  heparin ADULT infusion 100 units/mL (25000 units/217mL sodium chloride 0.45%) (750 Units/hr Intravenous New Bag/Given 08/25/18 1126)  acetaminophen (TYLENOL) tablet 650 mg (has no administration in time range)    Or  acetaminophen (TYLENOL) suppository 650 mg (has no administration in time range)  senna-docusate (Senokot-S) tablet 1 tablet (has no administration in time range)  promethazine (PHENERGAN) tablet 12.5 mg (has no administration in time range)  iohexol (OMNIPAQUE) 350 MG/ML injection 69 mL (69 mLs Intravenous Contrast Given 08/25/18 1001)  heparin bolus via infusion 2,700 Units (2,700 Units Intravenous Bolus from Bag 08/25/18 1126)    Mobility walks Low fall risk   Focused Assessments Pulmonary Assessment Handoff:  Lung sounds:  clear  O2 Device: Room Air        R Recommendations: See Admitting Provider Note  Report given to:   Additional Notes:

## 2018-08-25 NOTE — TOC Initial Note (Signed)
Transition of Care [facility]) - Initial/Assessment Note    Patient Details  Name: Leslie Cross MRN: [id number] Date of Birth: 1950/08/27  [facility] CM/SW Contact:    Bartholomew Crews, RN Phone Number: [phone] 08/25/2018, 3:05 PM  Clinical Narrative:                 Received consult for patient needing PCP close to home and medication assistance for anticoagulant. Spoke with patient at the bedside who advised that PCP in [facility] would be closest stating her mailing address in [place] is not her home address. Patient is agreeable to appointment at [facility] or [facility]. Appointment scheduled at [facility] 7/27 2:30pm.   Patient needs prescription assistance for oral anticoagulant. Transitions of Care pharmacy not open on the weekend. MD notified for prescription.   Patient is determined to be discharged no later than tomorrow. Will continue to follow for transition of care needs.   Expected Discharge Plan: Home/Self Care Barriers to Discharge: Continued Medical Work up   Patient Goals and CMS Choice Patient states their goals for this hospitalization and ongoing recovery are:: "I'm going home tomorrow even if I have to hitchhike"   Choice offered to / list presented to : NA  Expected Discharge Plan and Services Expected Discharge Plan: Home/Self Care In-house Referral: PCP / [facility] Discharge Planning Services: Medication Assistance Post Acute Care Choice: NA                   DME Arranged: N/A DME Agency: NA       HH Arranged: NA HH Agency: NA        Prior Living Arrangements/Services     Patient language and need for interpreter reviewed:: Yes              Criminal Activity/Legal Involvement Pertinent to Current Situation/Hospitalization: No - Comment as needed  Activities of Daily Living      Permission Sought/Granted                  Emotional Assessment Appearance:: Appears younger than stated  age Attitude/Demeanor/Rapport: Engaged Affect (typically observed): Accepting Orientation: : Oriented to Self, Oriented to Place, Oriented to  Time, Oriented to Situation Alcohol / Substance Use: Not Applicable Psych Involvement: No (comment)  Admission diagnosis:  Pulmonary infarct (HCC) [I26.99] Acute deep vein thrombosis (DVT) of popliteal vein of right lower extremity (HCC) [I82.431] Multiple subsegmental pulmonary emboli without acute cor pulmonale [I26.94] Patient Active Problem List   Diagnosis Date Noted  . Pulmonary infarct [facility]) 08/25/2018   PCP:  Patient, No Pcp Per Pharmacy:   [facility] [address] [place] [address] Phone: [phone] Fax: [place], [place] [address] [place] [address] Phone: [phone] Fax: [phone]     Social Determinants of Health (SDOH) Interventions    Readmission Risk Interventions No flowsheet data found.

## 2018-08-25 NOTE — Discharge Instructions (Signed)
Follow up for Primary Care:    [facility] Address: [place], [place], [place] [address] Phone: [phone]

## 2018-08-25 NOTE — Progress Notes (Signed)
Echocardiogram 2D Echocardiogram has been performed.  Leslie Cross 08/25/2018, 2:11 PM

## 2018-08-25 NOTE — Progress Notes (Signed)
ANTICOAGULATION CONSULT NOTE - Initial Consult  Pharmacy Consult for heparin Indication: DVT  Allergies  Allergen Reactions  . Cephalexin   . Erythromycin Base   . Other     All Cillins, Mycins   . Penicillins     Patient Measurements: Height: 5\' 2"  (157.5 cm) Weight: 98 lb (44.5 kg) IBW/kg (Calculated) : 50.1 Heparin Dosing Weight: 44.5  Vital Signs: Temp: 98.5 F (36.9 C) (07/10 0753) Temp Source: Oral (07/10 0753) BP: 122/63 (07/10 0830) Pulse Rate: 78 (07/10 0830)  Labs: Recent Labs    08/25/18 0805  HGB 11.5*  HCT 34.3*  PLT 279  CREATININE 0.83  TROPONINIHS 6    Estimated Creatinine Clearance: 46.2 mL/min (by C-G formula based on SCr of 0.83 mg/dL).   Medical History: No past medical history on file.  Medications:  Scheduled:   Assessment: Right leg doppler ultrasound reveals acute DVT. CT angio impression pending to rule out PE. Patient does not take any anticoagulants PTA.   Goal of Therapy:  Heparin level 0.3-0.7 units/ml Monitor platelets by anticoagulation protocol: Yes   Plan:  Give 2700 units bolus x 1 Start heparin infusion at 750 units/hr Check anti-Xa level in 6 hours and daily while on heparin Continue to monitor H&H and platelets   Kennon Holter, PharmD PGY1 New Site Resident [employer] Phone: [phone] 08/25/2018,10:24 AM

## 2018-08-25 NOTE — Discharge Summary (Addendum)
Name: Leslie Cross MRN: [id number] DOB: [birth year] 68 y.o. PCP: Patient, No Pcp Per  Date of Admission: 08/25/2018  7:33 AM Date of Discharge:  08/25/18  Attending Physician: Oda Kilts, MD  Discharge Diagnosis: 1. Bilateral pulmonary emboli 2. DVT  Discharge Medications: Allergies as of 08/25/2018      Reactions   Cephalexin Anaphylaxis   Erythromycin Base Anaphylaxis   Other Other (See Comments)   All Cillins, Mycins  No meat   Penicillins Other (See Comments)   Did it involve swelling of the face/tongue/throat, SOB, or low BP? Unknown Did it involve sudden or severe rash/hives, skin peeling, or any reaction on the inside of your mouth or nose? Unknown Did you need to seek medical attention at a hospital or doctor's office? Unknown When did it last happen?10 years If all above answers are "NO", may proceed with cephalosporin use.      Medication List    TAKE these medications   aspirin 325 MG tablet Take 325 mg by mouth once.   calcium-vitamin D 500-200 MG-UNIT tablet Commonly known as: OSCAL WITH D Take 1 tablet by mouth at bedtime.   Eliquis DVT/PE Starter Pack 5 MG Tabs Take as directed on package: start with two-5mg  tablets twice daily for 7 days. On day 8, switch to one-5mg  tablet twice daily.   vitamin C 500 MG tablet Commonly known as: ASCORBIC ACID Take 500 mg by mouth at bedtime.   vitamin E 400 UNIT capsule Take 400 Units by mouth at bedtime.       Disposition and follow-up:   Leslie Cross was discharged from [facility] in Stable condition.  At the hospital follow up visit please address:  1.  PE/DVT: ensure taking apixiban, symptoms improving, discuss return to work  2.  Labs / imaging needed at time of follow-up:   A. BMP, CBC  B. Will eventually need follow up CT chest to ensure no malignancy per radiology recommendations  C. Thyroid US for possible goiter  3.  Pending labs/ test needing  follow-up: Hypercoagulable testing  Follow-up Appointments: Follow-up Information    [facility] Follow up.   Why: Our office will give you a call to schedule a hospital follow up appointment in about 1 week.  Contact information: [address] [place] [facility] Course by problem list: 1. Bilateral pulmonary emboli and right leg DVT: 68 yo woman presented with right leg swelling, pain, and dyspnea. Found to have bilateral pulmonary emboli with fairly extensive thrombus and associated infarcts. No right heart strain on CT, TTE, EKG, negative troponin. Started on heparin gtt, had normal vital signs throughout the day. She was desperate to go home, given low PESI score (67), normal vitals, no evidence of right heart strain, granted her wish to go home. She received her apixiban starter pack before discharge.   She should follow up in our clinic. 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 She also has multiple family members with DVT, so hypercoagulable workup (pending) will be of interest.   Discharge Vitals:   BP 111/66 (BP Location: Left Arm)   Pulse 76   Temp 98.8 F (37.1 C) (Oral)   Resp 16   Ht 5\' 2"  (1.575 m)   Wt 44.5 kg   SpO2 100%   BMI  17.92 kg/m    Discharge Exam: General: sitting up in bed, comfortable, thin HEENT: moist mucous membranes Cardiac: RRR, no murmurs Pulmonary: clear bilaterally, normal respiratory effort Abdomen: soft, non-tender, normal bowel sounds Extremities: mild swelling of right calf, no tenderness, no erythema Neuro: intact strength and sensation in both feet Psych: normal affect  Pertinent Labs, Studies, and Procedures:  CBC-hemoglobin 11, hematocrit 34, MCV 102, platelets 279 BMP-na 141, k 3.8, co2 26, cr 0.83 BNP 96 Troponin 6 COVID19 pending   EKG: personally reviewed, normal  sinus rhythm no ischemic changes  Chest x-ray 08/25/2018  Two ill-defined nodular densities in left mid lung and lateral right lung base. Differential diagnosis includes infiltrative neoplasm. Recommend chest CT without contrast for further evaluation.  CTA chest 08/25/2018 1. Bilateral pulmonary emboli, fairly extensive bilaterally including occlusive thrombus within the central pulmonary artery branch to the anterior segment of the LEFT upper lobe, with associated pulmonary infarcts within the RIGHT upper lobe and both lower lobes. Recommend follow-up chest CT at some point to ensure resolution of the presumed infarcts and exclude the less likely possibility of underlying neoplastic process. 2. No CT evidence of RIGHT heart strain. No pleural effusion or pulmonary edema. 3. Asymmetric prominence of the RIGHT thyroid lobe, presumably goiter. Consider nonemergent follow-up with thyroid ultrasound and/or nuclear medicine thyroid scan.  Right lower extremity Doppler 08/25/2018 Preliminary: DVT in the right popliteal vein, right posterior tibial veins gastrocnemius vein  Discharge Instructions: Discharge Instructions    Call MD for:  difficulty breathing, headache or visual disturbances   Complete by: As directed    Call MD for:  extreme fatigue   Complete by: As directed    Call MD for:  persistant dizziness or light-headedness   Complete by: As directed    Diet - low sodium heart healthy   Complete by: As directed    Discharge instructions   Complete by: As directed    Leslie Cross   You were admitted to the hospital with shortness of breath and were found to have a pulmonary embolism in your lungs.  You were started on a blood thinner called Eliquis to treat this.  It will be very important you take this every day twice a day to help dissolve the clot.  Please return to the ED if you experience worsening chest pain or shortness of breath, or if you start coughing up blood.   Please stay out of work for the next week. We will reassess if you need to stay out longer at your follow up visit with [place].  Call us at [phone] if you have any questions or concerns.  Our office will give you a call tomorrow to schedule a hospital follow-up appointment in the next week.   Increase activity slowly   Complete by: As directed       Signed: Oda Kilts, MD 08/25/2018, 6:47 PM

## 2018-08-25 NOTE — ED Notes (Signed)
Admitting provider at bedside.

## 2018-08-25 NOTE — Progress Notes (Signed)
Lower extremity venous has been completed.   Preliminary results in CV Proc.   Results given to Rn.   Abram Sander 08/25/2018 8:36 AM

## 2018-08-26 LAB — NOVEL CORONAVIRUS, NAA (HOSP ORDER, SEND-OUT TO REF LAB; TAT 18-24 HRS): SARS-CoV-2, NAA: NOT DETECTED

## 2018-08-26 LAB — HIV ANTIBODY (ROUTINE TESTING W REFLEX): HIV Screen 4th Generation wRfx: NONREACTIVE

## 2018-08-28 ENCOUNTER — Telehealth: Payer: Self-pay | Admitting: *Deleted

## 2018-08-28 LAB — LUPUS ANTICOAGULANT PANEL
DRVVT: 48.4 s — ABNORMAL HIGH (ref 0.0–47.0)
PTT Lupus Anticoagulant: 36.4 s (ref 0.0–51.9)

## 2018-08-28 LAB — CARDIOLIPIN ANTIBODIES, IGG, IGM, IGA
Anticardiolipin IgA: <9 APL U/mL (ref 0–11)
Anticardiolipin IgG: <9 GPL U/mL (ref 0–14)
Anticardiolipin IgM: <9 MPL U/mL (ref 0–12)

## 2018-08-28 LAB — PROTEIN S ACTIVITY: Protein S Activity: 110 % (ref 63–140)

## 2018-08-28 LAB — PROTEIN S, TOTAL: Protein S Ag, Total: 117 % (ref 60–150)

## 2018-08-28 LAB — PROTEIN C, TOTAL: Protein C, Total: 89 % (ref 60–150)

## 2018-08-28 LAB — DRVVT MIX: dRVVT Mix: 40.9 s (ref 0.0–47.0)

## 2018-08-28 LAB — PROTEIN C ACTIVITY: Protein C Activity: 97 % (ref 73–180)

## 2018-08-28 NOTE — Telephone Encounter (Signed)
Pt released from hospital 08/25/2018, bilateral PEs, DVT.  Called stated the Eliquis is "Making me feel jittery." States "Like nervous inside." Denies any other symptoms. States she has "A lot of medication allergies."   No PCP. States she called physician  she was set up with for hospital F/U. States she was told they could not help her.  Appt with Dr. Margarita Rana on 09/11/2018.  Directed to SW/CM at [facility] who assisted pt in hospital. Directed to go to ED if symptoms worsen. Pt verbalizes understanding.

## 2018-08-28 NOTE — Progress Notes (Signed)
Received messaged that patient had called with questions concerning eliquis prescription. Returned call to [phone]. Patient had received eliquis prescription from [facility] prior to transitioning home on Friday. CM advised that since the [facility] had filled the prescription at discharge that a card was not needed to take home. CM provided toll free number for eliquis  [phone] ([phone]) for follow up with patient medication assistance. Patient expressed appreciation for call back.   Manya Silvas, RN MSN CCM [facility] [phone]

## 2018-08-29 DIAGNOSIS — I2699 Other pulmonary embolism without acute cor pulmonale: Secondary | ICD-10-CM

## 2018-08-29 HISTORY — DX: Other pulmonary embolism without acute cor pulmonale: I26.99

## 2018-08-29 LAB — BETA-2-GLYCOPROTEIN I ABS, IGG/M/A
Beta-2 Glyco I IgG: <9 GPI IgG units (ref 0–20)
Beta-2-Glycoprotein I IgA: <9 GPI IgA units (ref 0–25)
Beta-2-Glycoprotein I IgM: <9 GPI IgM units (ref 0–32)

## 2018-09-04 LAB — PROTHROMBIN GENE MUTATION

## 2018-09-04 LAB — FACTOR 5 LEIDEN

## 2018-09-11 ENCOUNTER — Ambulatory Visit: Payer: Self-pay | Attending: Family Medicine | Admitting: Family Medicine

## 2018-09-11 ENCOUNTER — Encounter: Payer: Self-pay | Admitting: Family Medicine

## 2018-09-11 ENCOUNTER — Other Ambulatory Visit: Payer: Self-pay

## 2018-09-11 VITALS — BP 97/63 | HR 76 | Temp 98.1°F | Resp 20 | Ht 62.0 in | Wt 100.0 lb

## 2018-09-11 DIAGNOSIS — Z880 Allergy status to penicillin: Secondary | ICD-10-CM | POA: Insufficient documentation

## 2018-09-11 DIAGNOSIS — Z7982 Long term (current) use of aspirin: Secondary | ICD-10-CM | POA: Insufficient documentation

## 2018-09-11 DIAGNOSIS — Z86718 Personal history of other venous thrombosis and embolism: Secondary | ICD-10-CM | POA: Insufficient documentation

## 2018-09-11 DIAGNOSIS — H538 Other visual disturbances: Secondary | ICD-10-CM | POA: Insufficient documentation

## 2018-09-11 DIAGNOSIS — F17210 Nicotine dependence, cigarettes, uncomplicated: Secondary | ICD-10-CM | POA: Insufficient documentation

## 2018-09-11 DIAGNOSIS — I2699 Other pulmonary embolism without acute cor pulmonale: Secondary | ICD-10-CM | POA: Insufficient documentation

## 2018-09-11 DIAGNOSIS — Z881 Allergy status to other antibiotic agents status: Secondary | ICD-10-CM | POA: Insufficient documentation

## 2018-09-11 DIAGNOSIS — Z720 Tobacco use: Secondary | ICD-10-CM

## 2018-09-11 MED ORDER — APIXABAN 5 MG PO TABS: 5.0000 mg | ORAL_TABLET | Freq: Two times a day (BID) | ORAL | 6 refills | Status: DC

## 2018-09-11 NOTE — Progress Notes (Signed)
Feels like she has something in left eye. Noticed blurred vision just since being on the eliquis or in the hospital

## 2018-09-11 NOTE — Progress Notes (Signed)
Subjective:  Patient ID: Leslie Cross, female    DOB: 1951-01-27  Age: 68 y.o. MRN: [id number]  CC: Hospitalization Follow-up   HPI Leslie Cross is a 68 year old female with a previous history of unprovoked DVT recently hospitalized at [facility] on 08/25/2018 for Pulmonary Embolism after she had presented with right leg swelling, pain and dyspnea.  Lower extremity doppler: Right: Findings consistent with acute deep vein thrombosis involving the right popliteal vein, right posterior tibial veins, right peroneal veins, and right gastrocnemius veins. No cystic structure found in the popliteal fossa. Left: No evidence of common femoral vein obstruction.  CTA of the chest : IMPRESSION: 1. Bilateral pulmonary emboli, fairly extensive bilaterally including occlusive thrombus within the central pulmonary artery branch to the anterior segment of the LEFT upper lobe, with associated pulmonary infarcts within the RIGHT upper lobe and both lower lobes. Recommend follow-up chest CT at some point to ensure resolution of the presumed infarcts and exclude the less likely possibility of underlying neoplastic process. 2. No CT evidence of RIGHT heart strain. No pleural effusion or pulmonary edema. 3. Asymmetric prominence of the RIGHT thyroid lobe, presumably goiter. Consider nonemergent follow-up with thyroid ultrasound and/or nuclear medicine thyroid scan.  Echocardiogram revealed normal EF, impaired relaxation of left ventricle.  She was commenced on Eliquis and subsequently discharged.  Today she reports doing well, denies leg pain, dyspnea, chest pain but has noticed blurry vision in left eye since commencing Eliquis. Denies bleeding or bruising. She smokes cigarrettes and is not ready to quit.  No past medical history on file.  No past surgical history on file.  No family history on file.   Allergies  Allergen Reactions  . Cephalexin Anaphylaxis  . Erythromycin  Base Anaphylaxis  . Other Other (See Comments)    All Cillins, Mycins  No meat  . Penicillins Other (See Comments)    Did it involve swelling of the face/tongue/throat, SOB, or low BP? Unknown Did it involve sudden or severe rash/hives, skin peeling, or any reaction on the inside of your mouth or nose? Unknown Did you need to seek medical attention at a hospital or doctor's office? Unknown When did it last happen?10 years If all above answers are "NO", may proceed with cephalosporin use.    Outpatient Medications Prior to Visit  Medication Sig Dispense Refill  . calcium-vitamin D (OSCAL WITH D) 500-200 MG-UNIT tablet Take 1 tablet by mouth at bedtime.    . vitamin C (ASCORBIC ACID) 500 MG tablet Take 500 mg by mouth at bedtime.    . Eliquis DVT/PE Starter Pack (ELIQUIS STARTER PACK) 5 MG TABS Take as directed on package: start with two-5mg  tablets twice daily for 7 days. On day 8, switch to one-5mg  tablet twice daily. 1 each 0  . aspirin 325 MG tablet Take 325 mg by mouth once.    . vitamin E 400 UNIT capsule Take 400 Units by mouth at bedtime.     No facility-administered medications prior to visit.      ROS Review of Systems  Constitutional: Negative for activity change, appetite change and fatigue.  HENT: Negative for congestion, sinus pressure and sore throat.   Eyes: Positive for visual disturbance.  Respiratory: Negative for cough, chest tightness, shortness of breath and wheezing.   Cardiovascular: Negative for chest pain and palpitations.  Gastrointestinal: Negative for abdominal distention, abdominal pain and constipation.  Endocrine: Negative for polydipsia.  Genitourinary: Negative for dysuria and frequency.  Musculoskeletal: Negative  for arthralgias and back pain.  Skin: Negative for rash.  Neurological: Negative for tremors, light-headedness and numbness.  Hematological: Does not bruise/bleed easily.  Psychiatric/Behavioral: Negative for agitation and  behavioral problems.    Objective:  BP 97/63   Pulse 76   Temp 98.1 F (36.7 C) (Oral)   Resp 20   Ht 5\' 2"  (1.575 m)   Wt 100 lb (45.4 kg)   SpO2 98%   BMI 18.29 kg/m   BP/Weight 09/11/2018 08/25/2018 4/31/5400  Systolic BP 97 867 619  Diastolic BP 63 66 92  Wt. (Lbs) 100 98 -  BMI 18.29 17.92 -      Physical Exam Constitutional:      Appearance: She is well-developed.  Eyes:     Conjunctiva/sclera: Conjunctivae normal.     Pupils: Pupils are equal, round, and reactive to light.     Comments: Visual acuity 20/20  Cardiovascular:     Rate and Rhythm: Normal rate.     Heart sounds: Normal heart sounds. No murmur.  Pulmonary:     Effort: Pulmonary effort is normal.     Breath sounds: Normal breath sounds. No wheezing or rales.  Chest:     Chest wall: No tenderness.  Abdominal:     General: Bowel sounds are normal. There is no distension.     Palpations: Abdomen is soft. There is no mass.     Tenderness: There is no abdominal tenderness.  Musculoskeletal: Normal range of motion.  Neurological:     Mental Status: She is alert and oriented to person, place, and time.     CMP Latest Ref Rng & Units 08/25/2018  Glucose 70 - 99 mg/dL 93  BUN 8 - 23 mg/dL 7(L)  Creatinine 0.44 - 1.00 mg/dL 0.83  Sodium 135 - 145 mmol/L 141  Potassium 3.5 - 5.1 mmol/L 3.8  Chloride 98 - 111 mmol/L 105  CO2 22 - 32 mmol/L 26  Calcium 8.9 - 10.3 mg/dL 8.9    Lipid Panel  No results found for: CHOL, TRIG, HDL, CHOLHDL, VLDL, LDLCALC, LDLDIRECT  CBC    Component Value Date/Time   WBC 7.6 08/25/2018 0805   RBC 3.34 (L) 08/25/2018 0805   HGB 11.5 (L) 08/25/2018 0805   HCT 34.3 (L) 08/25/2018 0805   PLT 279 08/25/2018 0805   MCV 102.7 (H) 08/25/2018 0805   MCH 34.4 (H) 08/25/2018 0805   MCHC 33.5 08/25/2018 0805   RDW 13.7 08/25/2018 0805   LYMPHSABS 1.4 08/25/2018 0805   MONOABS 0.5 08/25/2018 0805   EOSABS 0.1 08/25/2018 0805   BASOSABS 0.0 08/25/2018 0805    No results  found for: HGBA1C  Assessment & Plan:   1. Bilateral pulmonary embolism (HCC) Previous history of DVT Will need lifelong anticoagulation I have completed a medication assistance paper work which she brought in today - apixaban (ELIQUIS) 5 MG TABS tablet; Take 1 tablet (5 mg total) by mouth 2 (two) times daily.  Dispense: 60 tablet; Refill: 6  2. Tobacco abuse Spent 3 minutes counseling on cessation, hazardous effects of Tobacco use and she is not ready to quit  3. Blurry vision, left eye Visual acuity is normal in the Clinic Advised to see her [place] Maintenance: Discussed the need for PAP, mammogram,Colonoscopy Dexa scan and she is not interested despite discussing the implications of her decision.  Meds ordered this encounter  Medications  . apixaban (ELIQUIS) 5 MG TABS tablet    Sig: Take 1 tablet (  5 mg total) by mouth 2 (two) times daily.    Dispense:  60 tablet    Refill:  6    Follow-up: Return in about 3 months (around 12/12/2018) for medical conditions.       Charlott Rakes, MD, FAAFP. [facility] and [place], [place]   09/11/2018, 3:14 PM

## 2018-09-11 NOTE — Patient Instructions (Signed)
Steps to Quit Smoking Smoking tobacco is the leading cause of preventable death. It can affect almost every organ in the body. Smoking puts you and people around you at risk for many serious, long-lasting (chronic) diseases. Quitting smoking can be hard, but it is one of the best things that you can do for your health. It is never too late to quit. How do I get ready to quit? When you decide to quit smoking, make a plan to help you succeed. Before you quit:  Pick a date to quit. Set a date within the next 2 weeks to give you time to prepare.  Write down the reasons why you are quitting. Keep this list in places where you will see it often.  Tell your family, friends, and co-workers that you are quitting. Their support is important.  Talk with your doctor about the choices that may help you quit.  Find out if your health insurance will pay for these treatments.  Know the people, places, things, and activities that make you want to smoke (triggers). Avoid them. What first steps can I take to quit smoking?  Throw away all cigarettes at home, at work, and in your car.  Throw away the things that you use when you smoke, such as ashtrays and lighters.  Clean your car. Make sure to empty the ashtray.  Clean your home, including curtains and carpets. What can I do to help me quit smoking? Talk with your doctor about taking medicines and seeing a counselor at the same time. You are more likely to succeed when you do both.  If you are pregnant or breastfeeding, talk with your doctor about counseling or other ways to quit smoking. Do not take medicine to help you quit smoking unless your doctor tells you to do so. To quit smoking: Quit right away  Quit smoking totally, instead of slowly cutting back on how much you smoke over a period of time.  Go to counseling. You are more likely to quit if you go to counseling sessions regularly. Take medicine You may take medicines to help you quit. Some  medicines need a prescription, and some you can buy over-the-counter. Some medicines may contain a drug called nicotine to replace the nicotine in cigarettes. Medicines may:  Help you to stop having the desire to smoke (cravings).  Help to stop the problems that come when you stop smoking (withdrawal symptoms). Your doctor may ask you to use:  Nicotine patches, gum, or lozenges.  Nicotine inhalers or sprays.  Non-nicotine medicine that is taken by mouth. Find resources Find resources and other ways to help you quit smoking and remain smoke-free after you quit. These resources are most helpful when you use them often. They include:  Online chats with a counselor.  Phone quitlines.  Printed self-help materials.  Support groups or group counseling.  Text messaging programs.  Mobile phone apps. Use apps on your mobile phone or tablet that can help you stick to your quit plan. There are many free apps for mobile phones and tablets as well as websites. Examples include Quit Guide from the CDC and smokefree.gov  What things can I do to make it easier to quit?   Talk to your family and friends. Ask them to support and encourage you.  Call a phone quitline (1-800-QUIT-NOW), reach out to support groups, or work with a counselor.  Ask people who smoke to not smoke around you.  Avoid places that make you want to smoke,   such as: ? Bars. ? Parties. ? Smoke-break areas at work.  Spend time with people who do not smoke.  Lower the stress in your life. Stress can make you want to smoke. Try these things to help your stress: ? Getting regular exercise. ? Doing deep-breathing exercises. ? Doing yoga. ? Meditating. ? Doing a body scan. To do this, close your eyes, focus on one area of your body at a time from head to toe. Notice which parts of your body are tense. Try to relax the muscles in those areas. How will I feel when I quit smoking? Day 1 to 3 weeks Within the first 24 hours,  you may start to have some problems that come from quitting tobacco. These problems are very bad 2-3 days after you quit, but they do not often last for more than 2-3 weeks. You may get these symptoms:  Mood swings.  Feeling restless, nervous, angry, or annoyed.  Trouble concentrating.  Dizziness.  Strong desire for high-sugar foods and nicotine.  Weight gain.  Trouble pooping (constipation).  Feeling like you may vomit (nausea).  Coughing or a sore throat.  Changes in how the medicines that you take for other issues work in your body.  Depression.  Trouble sleeping (insomnia). Week 3 and afterward After the first 2-3 weeks of quitting, you may start to notice more positive results, such as:  Better sense of smell and taste.  Less coughing and sore throat.  Slower heart rate.  Lower blood pressure.  Clearer skin.  Better breathing.  Fewer sick days. Quitting smoking can be hard. Do not give up if you fail the first time. Some people need to try a few times before they succeed. Do your best to stick to your quit plan, and talk with your doctor if you have any questions or concerns. Summary  Smoking tobacco is the leading cause of preventable death. Quitting smoking can be hard, but it is one of the best things that you can do for your health.  When you decide to quit smoking, make a plan to help you succeed.  Quit smoking right away, not slowly over a period of time.  When you start quitting, seek help from your doctor, family, or friends. This information is not intended to replace advice given to you by your health care provider. Make sure you discuss any questions you have with your health care provider. Document Released: 11/28/2008 Document Revised: 04/21/2018 Document Reviewed: 04/22/2018 Elsevier Patient Education  2020 Elsevier Inc.  

## 2018-09-12 ENCOUNTER — Telehealth: Payer: Self-pay | Admitting: Family Medicine

## 2018-09-12 NOTE — Telephone Encounter (Signed)
Paperwork has been received and will be filled out and faxed over to company.

## 2018-09-12 NOTE — Telephone Encounter (Signed)
Pt checking status for completion of medication assistance of Eliquis from [facility]. Pt states application was incomplete yesterday & they were faxing today for provider to finish completing it and fax back to them. Pt states ten day turnaround for her application to be approved to get the medication.

## 2018-09-14 ENCOUNTER — Telehealth: Payer: Self-pay | Admitting: *Deleted

## 2018-09-14 NOTE — Telephone Encounter (Signed)
Patient called stating she is needing a letter from her doctor releasing her to go back to work. Per pt she did not know she needed a letter and went back to work and her employer was not happy about returning without a letter. Patient number is [phone]. Per pt that letter needs to be emailed to her personally if possible. Per pt if this is not possible to please release the to her mychart.

## 2018-09-15 NOTE — Telephone Encounter (Signed)
LMOM

## 2018-09-15 NOTE — Telephone Encounter (Signed)
Letter has been sent via my chart.  If she needs a copy she can stop by the office to pick this up.

## 2018-09-19 ENCOUNTER — Telehealth: Payer: Self-pay | Admitting: Family Medicine

## 2018-09-19 NOTE — Telephone Encounter (Signed)
Lauren with the patient assistance foundation called to request additional information in order to process the patients application states they need to confirm where medication should be sent to and a signed authorization form from the patient. Please follow up.

## 2018-09-20 MED FILL — !ELIQUIS 5MG TABLET: 5 | 30 days supply | Qty: 60 | Fill #0

## 2018-09-20 NOTE — Telephone Encounter (Signed)
Pt called in regards to this assistance form, she would like for her medication be delivered to her address on file and please call her with an update

## 2018-09-20 NOTE — Telephone Encounter (Signed)
Patient was called and informed that a signature is needed and the patient consent form and some sections of the form needs to be filled out. Patient states that she has been talking with Claiborne Billings in the pharmacy in regards to the application. Patient states that she will come to the clinic to sign papers. Forms has been handed off to [facility] in pharmacy.

## 2018-11-07 ENCOUNTER — Telehealth: Payer: Self-pay | Admitting: Family Medicine

## 2018-11-07 NOTE — Telephone Encounter (Signed)
Please schedule patient an appointment with any PCP.

## 2018-11-07 NOTE — Telephone Encounter (Signed)
New Message  Pt is calling states she has bubbles in her urine and is wanting to do a urinalysis. Please f/u

## 2018-11-14 NOTE — Telephone Encounter (Signed)
Patient called stating they would like to get a urinalysis before their appointment scheduled for Thursday at 9:10. Please follow up

## 2018-11-15 NOTE — Telephone Encounter (Signed)
Patient will need to wait for appointment with walkin provider.

## 2018-11-16 ENCOUNTER — Other Ambulatory Visit: Payer: Self-pay

## 2018-11-16 ENCOUNTER — Ambulatory Visit: Payer: Self-pay

## 2018-11-16 ENCOUNTER — Ambulatory Visit: Payer: Self-pay | Attending: Family Medicine | Admitting: Physician Assistant

## 2018-11-16 DIAGNOSIS — R319 Hematuria, unspecified: Secondary | ICD-10-CM

## 2018-11-16 DIAGNOSIS — I824Y1 Acute embolism and thrombosis of unspecified deep veins of right proximal lower extremity: Secondary | ICD-10-CM

## 2018-11-16 LAB — POCT URINALYSIS DIP (CLINITEK)
Bilirubin, UA: NEGATIVE
Glucose, UA: NEGATIVE mg/dL
Ketones, POC UA: NEGATIVE mg/dL
Leukocytes, UA: NEGATIVE
Nitrite, UA: NEGATIVE
POC PROTEIN,UA: NEGATIVE
Spec Grav, UA: 1.02 (ref 1.010–1.025)
Urobilinogen, UA: 0.2 E.U./dL
pH, UA: 7.5 (ref 5.0–8.0)

## 2018-11-16 NOTE — Progress Notes (Signed)
Patient ID: Leslie Cross, female   DOB: Apr 16, 1950, 68 y.o.   MRN: [id number] Virtual Visit via Telephone Note  I connected with Leslie Cross on 11/16/18 at  9:10 AM EDT by telephone and verified that I am speaking with the correct person using two identifiers.   I discussed the limitations, risks, security and privacy concerns of performing an evaluation and management service by telephone and the availability of in person appointments. I also discussed with the patient that there may be a patient responsible charge related to this service. The patient expressed understanding and agreed to proceed.  Patient location:  Home My Location:  home office Persons on the call:  Me and the patient   History of Present Illness: Hematuria for about 1 month.  She says it started about 2 months after being put on Eliquis and would like to be put on a different blood thinner.  Says urine is "rust colored."  Some frequency.  Some pain over kidneys.  No dysuria.  No pelvic or abdominal pain.  She is adamant that she does not have a UTI.  She would also like to see about switching blood thinners in case the Eliquis is causing her S/Sx.     Observations/Objective: NAD.  A&Ox3  Assessment and Plan: 1. Hematuria, unspecified type - POCT URINALYSIS DIP (CLINITEK) - Urine Culture - CBC with Differential/Platelet - Basic metabolic panel  2. Acute deep vein thrombosis (DVT) of proximal vein of right lower extremity ([place]) Will send a msg to PCP(Dr Newlin)to advise on possibly switching blood thinners.   - CBC with Differential/Platelet  Follow Up Instructions: 4-6 weeks with PCP   I discussed the assessment and treatment plan with the patient. The patient was provided an opportunity to ask questions and all were answered. The patient agreed with the plan and demonstrated an understanding of the instructions.   The patient was advised to call back or seek an in-person evaluation if the symptoms worsen  or if the condition fails to improve as anticipated.  I provided 14 minutes of non-face-to-face time during this encounter.   Freeman Caldron, PA-C

## 2018-11-17 ENCOUNTER — Other Ambulatory Visit: Payer: Self-pay | Admitting: Physician Assistant

## 2018-11-17 DIAGNOSIS — I824Y1 Acute embolism and thrombosis of unspecified deep veins of right proximal lower extremity: Secondary | ICD-10-CM

## 2018-11-17 LAB — BASIC METABOLIC PANEL
BUN/Creatinine Ratio: 9 — ABNORMAL LOW (ref 12–28)
BUN: 7 mg/dL — ABNORMAL LOW (ref 8–27)
CO2: 23 mmol/L (ref 20–29)
Calcium: 9.3 mg/dL (ref 8.7–10.3)
Chloride: 103 mmol/L (ref 96–106)
Creatinine, Ser: 0.79 mg/dL (ref 0.57–1.00)
GFR calc Af Amer: 89 mL/min/{1.73_m2} (ref >59)
GFR calc non Af Amer: 77 mL/min/{1.73_m2} (ref >59)
Glucose: 133 mg/dL — ABNORMAL HIGH (ref 65–99)
Potassium: 4.4 mmol/L (ref 3.5–5.2)
Sodium: 140 mmol/L (ref 134–144)

## 2018-11-17 LAB — CBC WITH DIFFERENTIAL/PLATELET
Basophils Absolute: 0.1 10*3/uL (ref 0.0–0.2)
Basos: 1 %
EOS (ABSOLUTE): 0.1 10*3/uL (ref 0.0–0.4)
Eos: 1 %
Hematocrit: 35.1 % (ref 34.0–46.6)
Hemoglobin: 11.7 g/dL (ref 11.1–15.9)
Immature Grans (Abs): 0 10*3/uL (ref 0.0–0.1)
Immature Granulocytes: 0 %
Lymphocytes Absolute: 1.6 10*3/uL (ref 0.7–3.1)
Lymphs: 27 %
MCH: 33 pg (ref 26.6–33.0)
MCHC: 33.3 g/dL (ref 31.5–35.7)
MCV: 99 fL — ABNORMAL HIGH (ref 79–97)
Monocytes Absolute: 0.3 10*3/uL (ref 0.1–0.9)
Monocytes: 6 %
Neutrophils Absolute: 3.8 10*3/uL (ref 1.4–7.0)
Neutrophils: 65 %
Platelets: 394 10*3/uL (ref 150–450)
RBC: 3.55 x10E6/uL — ABNORMAL LOW (ref 3.77–5.28)
RDW: 12.9 % (ref 11.7–15.4)
WBC: 5.9 10*3/uL (ref 3.4–10.8)

## 2018-11-17 MED ORDER — RIVAROXABAN 20 MG PO TABS: 20.0000 mg | ORAL_TABLET | Freq: Every day | ORAL | 5 refills | Status: DC

## 2018-11-18 LAB — URINE CULTURE

## 2018-11-20 ENCOUNTER — Telehealth: Payer: Self-pay | Admitting: *Deleted

## 2018-11-20 DIAGNOSIS — I824Y1 Acute embolism and thrombosis of unspecified deep veins of right proximal lower extremity: Secondary | ICD-10-CM

## 2018-11-20 MED ORDER — RIVAROXABAN 20 MG PO TABS: 20.0000 mg | ORAL_TABLET | Freq: Every day | ORAL | 5 refills | Status: DC

## 2018-11-20 MED FILL — XARELTO 20 MG TABLET: 20 | 30 days supply | Qty: 30 | Fill #0

## 2018-11-20 NOTE — Telephone Encounter (Signed)
Xarelto sent to [facility] for financial reasons

## 2018-11-27 ENCOUNTER — Telehealth: Payer: Self-pay

## 2018-11-27 ENCOUNTER — Telehealth: Payer: Self-pay | Admitting: Family Medicine

## 2018-11-27 NOTE — Telephone Encounter (Signed)
Patient was seen in clinic 10/1 with hematuria and was switched from Eliquis to Xarelto. She reports continued hematuria and reports that this started over 1 month ago.

## 2018-11-27 NOTE — Telephone Encounter (Signed)
Patient was called and informed of appointment on 11/28/2018 a 410

## 2018-11-27 NOTE — Telephone Encounter (Signed)
Patient called stating she been having issues with the XARELTO states she has been having bubbles in her urine and that her kidneys are hurting. Please follow up.

## 2018-11-27 NOTE — Telephone Encounter (Signed)
Called patient to do their pre-visit COVID screening.  Call went to voicemail. Unable to do prescreening.  

## 2018-11-27 NOTE — Telephone Encounter (Signed)
[name] can you follow up with this patient for her Xarelto problem.

## 2018-11-28 ENCOUNTER — Other Ambulatory Visit: Payer: Self-pay

## 2018-11-28 ENCOUNTER — Telehealth: Payer: Self-pay | Admitting: *Deleted

## 2018-11-28 ENCOUNTER — Ambulatory Visit (INDEPENDENT_AMBULATORY_CARE_PROVIDER_SITE_OTHER): Payer: Self-pay | Admitting: Internal Medicine

## 2018-11-28 VITALS — BP 114/65 | HR 87 | Temp 98.1°F | Wt 99.6 lb

## 2018-11-28 DIAGNOSIS — R300 Dysuria: Secondary | ICD-10-CM

## 2018-11-28 DIAGNOSIS — R310 Gross hematuria: Secondary | ICD-10-CM

## 2018-11-28 LAB — POCT URINALYSIS DIP (CLINITEK)
Bilirubin, UA: NEGATIVE
Glucose, UA: NEGATIVE mg/dL
Ketones, POC UA: NEGATIVE mg/dL
Nitrite, UA: NEGATIVE
POC PROTEIN,UA: NEGATIVE
Spec Grav, UA: 1.015 (ref 1.010–1.025)
Urobilinogen, UA: 0.2 E.U./dL
pH, UA: 6.5 (ref 5.0–8.0)

## 2018-11-28 NOTE — Progress Notes (Signed)
Patient ID: Leslie Cross, female    DOB: Jul 04, 1950  MRN: [id number]  CC: Hematuria   Subjective: Leslie Cross is a 68 y.o. female who presents for urgent care evaluation at [facility] Her concerns today include:   Patient complains of gross hematuria that started about 2 months after she was placed on Eliquis for DVT/PE in July.  She had a telemetry visit with our PA on 11/16/2018 and requested to be changed to a different anticoagulant.  She was changed to Xarelto but states that she still has hematuria.  She denies any dysuria.  UA was negative for UTI.  CBC revealed hemoglobin 10.7 which is fairly stable compared to 3 months ago.  She is frustrated and wants to get off anticoagulation.  She gives history of blood clots in the past about 30 years ago prior to the most recent one which was unprovoked.    Patient Active Problem List   Diagnosis Date Noted  . 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 Acute deep vein thrombosis (DVT) of right lower extremity ([place]) 08/25/2018     Current Outpatient Medications on File Prior to Visit  Medication Sig Dispense Refill  . calcium-vitamin D (OSCAL WITH D) 500-200 MG-UNIT tablet Take 1 tablet by mouth at bedtime.    . rivaroxaban (XARELTO) 20 MG TABS tablet Take 1 tablet (20 mg total) by mouth daily with supper. 30 tablet 5  . vitamin C (ASCORBIC ACID) 500 MG tablet Take 500 mg by mouth at bedtime.     No current facility-administered medications on file prior to visit.     Allergies  Allergen Reactions  . Cephalexin Anaphylaxis  . Erythromycin Base Anaphylaxis  . Other Other (See Comments)    All Cillins, Mycins  No meat  . Penicillins Other (See Comments)    Did it involve swelling of the face/tongue/throat, SOB, or low BP? Unknown Did it involve sudden or severe rash/hives, skin peeling, or any reaction on the inside of your mouth or nose? Unknown Did you need to seek medical attention at a hospital or doctor's office? Unknown When  did it last happen?10 years If all above answers are "NO", may proceed with cephalosporin use.    Social History   Socioeconomic History  . Marital status: Married    Spouse name: Not on file  . Number of children: Not on file  . Years of education: Not on file  . Highest education level: Not on file  Occupational History  . Not on file  Social Needs  . Financial resource strain: Patient refused  . Food insecurity    Worry: Patient refused    Inability: Patient refused  . Transportation needs    Medical: Patient refused    Non-medical: Patient refused  Tobacco Use  . Smoking status: Current Every Day Smoker    Packs/day: 0.50    Types: Cigarettes  . Smokeless tobacco: Never Used  Substance and Sexual Activity  . Alcohol use: No  . Drug use: No  . Sexual activity: Not on file  Lifestyle  . Physical activity    Days per week: Patient refused    Minutes per session: Patient refused  . Stress: Not on file  Relationships  . Social Herbalist on phone: Patient refused    Gets together: Patient refused    Attends religious service: Patient refused    Active member of club or organization: Patient refused    Attends meetings of clubs or  organizations: Patient refused    Relationship status: Patient refused  . Intimate partner violence    Fear of current or ex partner: Patient refused    Emotionally abused: Patient refused    Physically abused: Patient refused    Forced sexual activity: Patient refused  Other Topics Concern  . Not on file  Social History Narrative  . Not on file    No family history on file.  No past surgical history on file.  ROS: Review of Systems Negative except as stated above  PHYSICAL EXAM: BP 114/65   Pulse 87   Temp 98.1 F (36.7 C) (Temporal)   Wt 99 lb 9.6 oz (45.2 kg)   SpO2 95%   BMI 18.22 kg/m   Physical Exam  General appearance - alert, well appearing, elderly Caucasian female and in no distress Mental  status - normal mood, behavior, speech, dress, motor activity, and thought processes   CMP Latest Ref Rng & Units 11/16/2018 08/25/2018  Glucose 65 - 99 mg/dL 133(H) 93  BUN 8 - 27 mg/dL 7(L) 7(L)  Creatinine 0.57 - 1.00 mg/dL 0.79 0.83  Sodium 134 - 144 mmol/L 140 141  Potassium 3.5 - 5.2 mmol/L 4.4 3.8  Chloride 96 - 106 mmol/L 103 105  CO2 20 - 29 mmol/L 23 26  Calcium 8.7 - 10.3 mg/dL 9.3 8.9   Lipid Panel  No results found for: CHOL, TRIG, HDL, CHOLHDL, VLDL, LDLCALC, LDLDIRECT  CBC    Component Value Date/Time   WBC 5.9 11/16/2018 1135   WBC 7.6 08/25/2018 0805   RBC 3.55 (L) 11/16/2018 1135   RBC 3.34 (L) 08/25/2018 0805   HGB 11.7 11/16/2018 1135   HCT 35.1 11/16/2018 1135   PLT 394 11/16/2018 1135   MCV 99 (H) 11/16/2018 1135   MCH 33.0 11/16/2018 1135   MCH 34.4 (H) 08/25/2018 0805   MCHC 33.3 11/16/2018 1135   MCHC 33.5 08/25/2018 0805   RDW 12.9 11/16/2018 1135   LYMPHSABS 1.6 11/16/2018 1135   MONOABS 0.5 08/25/2018 0805   EOSABS 0.1 11/16/2018 1135   BASOSABS 0.1 11/16/2018 1135   Results for orders placed or performed in visit on 11/28/18  POCT URINALYSIS DIP (CLIN[facility]K)  Result Value Ref Range   Color, UA yellow yellow   Clarity, UA clear clear   Glucose, UA negative negative mg/dL   Bilirubin, UA negative negative   Ketones, POC UA negative negative mg/dL   Spec Grav, UA 1.015 1.010 - 1.025   Blood, UA large (A) negative   pH, UA 6.5 5.0 - 8.0   POC PROTEIN,UA negative negative, trace   Urobilinogen, UA 0.2 0.2 or 1.0 E.U./dL   Nitrite, UA Negative Negative   Leukocytes, UA Trace (A) Negative    ASSESSMENT AND PLAN:  1. Gross hematuria Advised that she needs further work-up to include advance imaging and referral to urology.  I do not advise stopping her anticoagulation without speaking to her PCP Dr. Margarita Rana.  She has an appointment with her later this month. - POCT URINALYSIS DIP (CLINITEK) - Ambulatory referral to Urology - CT Abdomen Pelvis  W Contrast; Future - CT Abdomen Pelvis Wo Contrast; Future   Patient was given the opportunity to ask questions.  Patient verbalized understanding of the plan and was able to repeat key elements of the plan.   Orders Placed This Encounter  Procedures  . CT Abdomen Pelvis W Contrast  . CT Abdomen Pelvis Wo Contrast  . Ambulatory referral to Urology  .  POCT URINALYSIS DIP (CLINITEK)     Requested Prescriptions    No prescriptions requested or ordered in this encounter    No follow-ups on file.  Karle Plumber, MD, FACP

## 2018-11-28 NOTE — Progress Notes (Signed)
Pain with urination & noticing blood in her urine. Denies bleeding gums.  States that she was recently started on Xarelto from Eliquis.

## 2018-11-28 NOTE — Telephone Encounter (Signed)
Notes recorded by Carilyn Goodpasture, RN on 11/28/2018 at 4:44 PM EDT  3rd attempt- Left message on voicemail. Patient informed that message was on MyChart and/or she can call our office if she has any questions.  ------   Notes recorded by Carilyn Goodpasture, RN on 11/20/2018 at 10:00 AM EDT  2nd attempt to contact patient regarding lab results. Results and result note visible via MyChart. Left message on voicemail to return call.  ------   Notes recorded by Carilyn Goodpasture, RN on 11/17/2018 at 4:20 PM EDT  Left message on voicemail to return call and that a medication was sent to the pharmacy to pick up. Did not disclose lab results.  ------   Notes recorded by Argentina Donovan, PA-C on 11/17/2018 at 12:49 PM EDT  I spoke with Dr. Margarita Rana. Per your request to change blood thinners, You may stop Eliquis and start on Xarelto 20mg  daily. I sent a new prescription to your pharmacy. Your kidney function and blood count are normal. The urology referral is placed in case the blood in the urine does not resolve. Drink plenty of water. Keep appt with Dr Margarita Rana at the end of the month. Thanks, Freeman Caldron, PA-C

## 2018-11-29 NOTE — Telephone Encounter (Signed)
Patient called in  Today for result note.  Pt name and DOB verified. Patient aware of results and result note per Freeman Caldron, PA-C.  She states she has already discontinued the Eliquis and is on Xarelto. She still has back pain, blood and fibers in her urine. Urine is foamy, has bubbles. She wanted to know if she could find out if she is allergic to contrast before the CT Scan. Advised patient there was not a definitive test to find out if she has an allergy to contrast but was informed that her kidney functions was tested on 11/16/2018 and medications she takes is not a contraindication to contrast.

## 2018-12-07 ENCOUNTER — Other Ambulatory Visit: Payer: Self-pay

## 2018-12-07 ENCOUNTER — Ambulatory Visit (HOSPITAL_COMMUNITY)
Admission: RE | Admit: 2018-12-07 | Discharge: 2018-12-07 | Disposition: A | Discharge status: Home / Self Care | Payer: Self-pay | Source: Ambulatory Visit | Attending: Internal Medicine | Admitting: Internal Medicine

## 2018-12-07 DIAGNOSIS — R310 Gross hematuria: Secondary | ICD-10-CM | POA: Insufficient documentation

## 2018-12-07 MED ORDER — IOHEXOL 300 MG/ML  SOLN
100.0000 mL | Freq: Once | INTRAMUSCULAR | Status: AC | PRN
  Administered 2018-12-07: 100 mL via INTRAVENOUS

## 2018-12-12 ENCOUNTER — Other Ambulatory Visit: Payer: Self-pay

## 2018-12-12 ENCOUNTER — Encounter: Payer: Self-pay | Admitting: Family Medicine

## 2018-12-12 ENCOUNTER — Ambulatory Visit: Payer: Self-pay | Attending: Family Medicine | Admitting: Family Medicine

## 2018-12-12 VITALS — BP 115/64 | HR 78 | Temp 98.0°F | Ht 62.0 in | Wt 98.6 lb

## 2018-12-12 DIAGNOSIS — Z881 Allergy status to other antibiotic agents status: Secondary | ICD-10-CM | POA: Insufficient documentation

## 2018-12-12 DIAGNOSIS — I82409 Acute embolism and thrombosis of unspecified deep veins of unspecified lower extremity: Secondary | ICD-10-CM

## 2018-12-12 DIAGNOSIS — R42 Dizziness and giddiness: Secondary | ICD-10-CM

## 2018-12-12 DIAGNOSIS — Z880 Allergy status to penicillin: Secondary | ICD-10-CM | POA: Insufficient documentation

## 2018-12-12 DIAGNOSIS — Z7901 Long term (current) use of anticoagulants: Secondary | ICD-10-CM | POA: Insufficient documentation

## 2018-12-12 DIAGNOSIS — Z79899 Other long term (current) drug therapy: Secondary | ICD-10-CM | POA: Insufficient documentation

## 2018-12-12 DIAGNOSIS — Z86718 Personal history of other venous thrombosis and embolism: Secondary | ICD-10-CM | POA: Insufficient documentation

## 2018-12-12 DIAGNOSIS — R399 Unspecified symptoms and signs involving the genitourinary system: Secondary | ICD-10-CM

## 2018-12-12 DIAGNOSIS — N200 Calculus of kidney: Secondary | ICD-10-CM

## 2018-12-12 DIAGNOSIS — I700 Atherosclerosis of aorta: Secondary | ICD-10-CM | POA: Insufficient documentation

## 2018-12-12 HISTORY — DX: Acute embolism and thrombosis of unspecified deep veins of unspecified lower extremity: I82.409

## 2018-12-12 LAB — POCT URINALYSIS DIP (CLINITEK)
Bilirubin, UA: NEGATIVE
Glucose, UA: NEGATIVE mg/dL
Ketones, POC UA: NEGATIVE mg/dL
Leukocytes, UA: NEGATIVE
Nitrite, UA: NEGATIVE
POC PROTEIN,UA: NEGATIVE
Spec Grav, UA: 1.01 (ref 1.010–1.025)
Urobilinogen, UA: 0.2 E.U./dL
pH, UA: 7 (ref 5.0–8.0)

## 2018-12-12 MED ORDER — RIVAROXABAN 10 MG PO TABS: 10.0000 mg | ORAL_TABLET | Freq: Every day | ORAL | 3 refills | Status: DC

## 2018-12-12 MED FILL — XARELTO 10 MG TABLET: 10 | 30 days supply | Qty: 30 | Fill #0

## 2018-12-12 NOTE — Progress Notes (Signed)
Patient states that when she takes her vitamins after she takes her Xarelto she feels like she is in another world.

## 2018-12-12 NOTE — Patient Instructions (Signed)
Rivaroxaban oral tablets What is this medicine? RIVAROXABAN (ri va ROX a ban) is an anticoagulant (blood thinner). It is used to treat blood clots in the lungs or in the veins. It is also used to prevent blood clots in the lungs or in the veins. It is also used to lower the chance of stroke in people with a medical condition called atrial fibrillation. This medicine may be used for other purposes; ask your health care provider or pharmacist if you have questions. COMMON BRAND NAME(S): Xarelto, Xarelto Starter Pack What should I tell my health care provider before I take this medicine? They need to know if you have any of these conditions:  antiphospholipid antibody syndrome  artificial heart valve  bleeding disorders  bleeding in the brain  blood in your stools (black or tarry stools) or if you have blood in your vomit  history of blood clots  history of stomach bleeding  kidney disease  liver disease  low blood counts, like low white cell, platelet, or red cell counts  recent or planned spinal or epidural procedure  take medicines that treat or prevent blood clots  an unusual or allergic reaction to rivaroxaban, other medicines, foods, dyes, or preservatives  pregnant or trying to get pregnant  breast-feeding How should I use this medicine? Take this medicine by mouth with a glass of water. Follow the directions on the prescription label. Take your medicine at regular intervals. Do not take it more often than directed. Do not stop taking except on your doctor's advice. Stopping this medicine may increase your risk of a blood clot. Be sure to refill your prescription before you run out of medicine. If you are taking this medicine after hip or knee replacement surgery, take it with or without food. If you are taking this medicine for atrial fibrillation, take it with your evening meal. If you are taking this medicine to treat blood clots, take it with food at the same time each  day. If you are unable to swallow your tablet, you may crush the tablet and mix it in applesauce. Then, immediately eat the applesauce. You should eat more food right after you eat the applesauce containing the crushed tablet. Talk to your pediatrician regarding the use of this medicine in children. Special care may be needed. Overdosage: If you think you have taken too much of this medicine contact a poison control center or emergency room at once. NOTE: This medicine is only for you. Do not share this medicine with others. What if I miss a dose? If you take your medicine once a day and miss a dose, take the missed dose as soon as you remember. If it is almost time for your next dose, take only that dose. Do not take double or extra doses. If you take your medicine twice a day and miss a dose, take the missed dose immediately. In this instance, 2 tablets may be taken at the same time. The next day you should take 1 tablet twice a day as directed. What may interact with this medicine? Do not take this medicine with any of the following medications:  defibrotide This medicine may also interact with the following medications:  aspirin and aspirin-like medicines  certain antibiotics like erythromycin, azithromycin, and clarithromycin  certain medicines for fungal infections like ketoconazole and itraconazole  certain medicines for irregular heart beat like amiodarone, quinidine, dronedarone  certain medicines for seizures like carbamazepine, phenytoin  certain medicines that treat or prevent blood clots  like warfarin, enoxaparin, and dalteparin  conivaptan  felodipine  indinavir  lopinavir; ritonavir  NSAIDS, medicines for pain and inflammation, like ibuprofen or naproxen  ranolazine  rifampin  ritonavir  SNRIs, medicines for depression, like desvenlafaxine, duloxetine, levomilnacipran, venlafaxine  SSRIs, medicines for depression, like citalopram, escitalopram, fluoxetine,  fluvoxamine, paroxetine, sertraline  St. John's wort  verapamil This list may not describe all possible interactions. Give your health care provider a list of all the medicines, herbs, non-prescription drugs, or dietary supplements you use. Also tell them if you smoke, drink alcohol, or use illegal drugs. Some items may interact with your medicine. What should I watch for while using this medicine? Visit your healthcare professional for regular checks on your progress. You may need blood work done while you are taking this medicine. Your condition will be monitored carefully while you are receiving this medicine. It is important not to miss any appointments. Avoid sports and activities that might cause injury while you are using this medicine. Severe falls or injuries can cause unseen bleeding. Be careful when using sharp tools or knives. Consider using an electric razor. Take special care brushing or flossing your teeth. Report any injuries, bruising, or red spots on the skin to your healthcare professional. If you are going to need surgery or other procedure, tell your healthcare professional that you are taking this medicine. Wear a medical ID bracelet or chain. Carry a card that describes your disease and details of your medicine and dosage times. What side effects may I notice from receiving this medicine? Side effects that you should report to your doctor or health care professional as soon as possible:  allergic reactions like skin rash, itching or hives, swelling of the face, lips, or tongue  back pain  redness, blistering, peeling or loosening of the skin, including inside the mouth  signs and symptoms of bleeding such as bloody or black, tarry stools; red or dark-brown urine; spitting up blood or brown material that looks like coffee grounds; red spots on the skin; unusual bruising or bleeding from the eye, gums, or nose  signs and symptoms of a blood clot such as chest pain;  shortness of breath; pain, swelling, or warmth in the leg  signs and symptoms of a stroke such as changes in vision; confusion; trouble speaking or understanding; severe headaches; sudden numbness or weakness of the face, arm or leg; trouble walking; dizziness; loss of coordination Side effects that usually do not require medical attention (report to your doctor or health care professional if they continue or are bothersome):  dizziness  muscle pain This list may not describe all possible side effects. Call your doctor for medical advice about side effects. You may report side effects to FDA at 1-800-FDA-1088. Where should I keep my medicine? Keep out of the reach of children. Store at room temperature between 15 and 30 degrees C (59 and 86 degrees F). Throw away any unused medicine after the expiration date. NOTE: This sheet is a summary. It may not cover all possible information. If you have questions about this medicine, talk to your doctor, pharmacist, or health care provider.  2020 Elsevier/Gold Standard (2018-05-01 09:45:59)  

## 2018-12-12 NOTE — Progress Notes (Signed)
Subjective:  Patient ID: Leslie Cross, female    DOB: [birth year]  Age: 68 y.o. MRN: [id number]  CC: Dizziness  HPI Leslie Cross  is a 68 year old female with a previous history of unprovoked DVT, diagnosed with pulmonary Embolism in 08/2018 and was on Eliquis but complained of Hematuria and requested a different anticoagulant for which she was switched to Xarelto by the physician assistant. She had another visit with Dr. Wynetta Emery 2 weeks ago and complained of similar symptoms with Eliquis.  Labs revealed normal hemoglobin. CT abdomen and pelvis with contrast revealed: IMPRESSION: Tiny nonobstructing RIGHT renal calculus.  Distal colonic diverticulosis without evidence of diverticulitis.  No definite acute intra-abdominal or intrapelvic abnormalities.  Aortic Atherosclerosis (ICD10-I70.0).   Today she denies flank pain or dysuria but complains of frothy urine which she describes as "bubbles in her urine", dizziness, hematuria.  She has noticed the dizziness when she takes her vitamins along with Xarelto and states this makes her feel like she is in another world.  On discontinuing her vitamins and continuing Xarelto symptoms have persisted.  At her job she has to frequently climb ladders and cannot afford to be dizzy.  Denies otalgia, hearing loss, nausea or vomiting; also denies sense of the room spinning. She states her son had a similar symptoms on Xarelto and this had to be decreased to 10 mg and she is wondering if this can be decreased.  Past Medical History:  Diagnosis Date  . Recurrent acute deep vein thrombosis (DVT) of lower extremity ([place]) 12/12/2018    History reviewed. No pertinent surgical history.  History reviewed. No pertinent family history.  Allergies  Allergen Reactions  . Cephalexin Anaphylaxis  . Erythromycin Base Anaphylaxis  . Other Other (See Comments)    All Cillins, Mycins  No meat  . Penicillins Other (See Comments)    Did it involve  swelling of the face/tongue/throat, SOB, or low BP? Unknown Did it involve sudden or severe rash/hives, skin peeling, or any reaction on the inside of your mouth or nose? Unknown Did you need to seek medical attention at a hospital or doctor's office? Unknown When did it last happen?10 years If all above answers are "NO", may proceed with cephalosporin use.    Outpatient Medications Prior to Visit  Medication Sig Dispense Refill  . rivaroxaban (XARELTO) 20 MG TABS tablet Take 1 tablet (20 mg total) by mouth daily with supper. 30 tablet 5  . calcium-vitamin D (OSCAL WITH D) 500-200 MG-UNIT tablet Take 1 tablet by mouth at bedtime.    . vitamin C (ASCORBIC ACID) 500 MG tablet Take 500 mg by mouth at bedtime.     No facility-administered medications prior to visit.      ROS Review of Systems  Constitutional: Negative for activity change, appetite change and fatigue.  HENT: Negative for congestion, sinus pressure and sore throat.   Eyes: Negative for visual disturbance.  Respiratory: Negative for cough, chest tightness, shortness of breath and wheezing.   Cardiovascular: Negative for chest pain and palpitations.  Gastrointestinal: Negative for abdominal distention, abdominal pain and constipation.  Endocrine: Negative for polydipsia.  Genitourinary: Positive for hematuria. Negative for dysuria, flank pain and frequency.  Musculoskeletal: Negative for arthralgias and back pain.  Skin: Negative for rash.  Neurological: Positive for light-headedness. Negative for tremors and numbness.  Hematological: Does not bruise/bleed easily.  Psychiatric/Behavioral: Negative for agitation and behavioral problems.    Objective:  BP 115/64   Pulse 78  Temp 98 F (36.7 C) (Oral)   Ht 5\' 2"  (1.575 m)   Wt 98 lb 9.6 oz (44.7 kg)   SpO2 100%   BMI 18.03 kg/m   BP/Weight 12/12/2018 11/28/2018 [id number]  Systolic BP [id number]  Diastolic BP 64 65 63  Wt. (Lbs) 98.6 99.6 100  BMI 18.03  18.22 18.29      Physical Exam Constitutional:      Appearance: She is well-developed.  Neck:     Vascular: No JVD.  Cardiovascular:     Rate and Rhythm: Normal rate.     Heart sounds: Normal heart sounds. No murmur.  Pulmonary:     Effort: Pulmonary effort is normal.     Breath sounds: Normal breath sounds. No wheezing or rales.  Chest:     Chest wall: No tenderness.  Abdominal:     General: Bowel sounds are normal. There is no distension.     Palpations: Abdomen is soft. There is no mass.     Tenderness: There is no abdominal tenderness.  Musculoskeletal: Normal range of motion.     Right lower leg: No edema.     Left lower leg: No edema.  Neurological:     Mental Status: She is alert and oriented to person, place, and time.  Psychiatric:        Mood and Affect: Mood normal.     CMP Latest Ref Rng & Units 11/16/2018 08/25/2018  Glucose 65 - 99 mg/dL 133(H) 93  BUN 8 - 27 mg/dL 7(L) 7(L)  Creatinine 0.57 - 1.00 mg/dL 0.79 0.83  Sodium 134 - 144 mmol/L 140 141  Potassium 3.5 - 5.2 mmol/L 4.4 3.8  Chloride 96 - 106 mmol/L 103 105  CO2 20 - 29 mmol/L 23 26  Calcium 8.7 - 10.3 mg/dL 9.3 8.9    Lipid Panel  No results found for: CHOL, TRIG, HDL, CHOLHDL, VLDL, LDLCALC, LDLDIRECT  CBC    Component Value Date/Time   WBC 5.9 11/16/2018 1135   WBC 7.6 08/25/2018 0805   RBC 3.55 (L) 11/16/2018 1135   RBC 3.34 (L) 08/25/2018 0805   HGB 11.7 11/16/2018 1135   HCT 35.1 11/16/2018 1135   PLT 394 11/16/2018 1135   MCV 99 (H) 11/16/2018 1135   MCH 33.0 11/16/2018 1135   MCH 34.4 (H) 08/25/2018 0805   MCHC 33.3 11/16/2018 1135   MCHC 33.5 08/25/2018 0805   RDW 12.9 11/16/2018 1135   LYMPHSABS 1.6 11/16/2018 1135   MONOABS 0.5 08/25/2018 0805   EOSABS 0.1 11/16/2018 1135   BASOSABS 0.1 11/16/2018 1135    No results found for: HGBA1C  Assessment & Plan:   1. Recurrent acute deep vein thrombosis (DVT) of lower extremity, unspecified laterality ([place]) Needs to remain  on lifelong anticoagulation Unable to tolerate Xarelto 20 mg due to dizziness We have discussed risks and benefits of anticoagulation and she would like to reduce to 10 mg of Xarelto. Previously unable to tolerate Eliquis - rivaroxaban (XARELTO) 10 MG TABS tablet; Take 1 tablet (10 mg total) by mouth daily with supper.  Dispense: 30 tablet; Refill: 3  2. Dizziness Possibly secondary to Xarelto as we have ruled out vertigo Encouraged to remain hydrated  3. Renal calculi Asymptomatic but could explain hematuria  4. Urinary symptom or sign Presence of urinary frothiness and hematuria We will send off urine for microscopy to evaluate for presence of casts given complaints of frothiness of urine accompanied by hematuria - Urinalysis, microscopic only  Health Care Maintenance: Due for bone density, mammogram, colonoscopy which she declines due to lack of medical coverage.  Meds ordered this encounter  Medications  . rivaroxaban (XARELTO) 10 MG TABS tablet    Sig: Take 1 tablet (10 mg total) by mouth daily with supper.    Dispense:  30 tablet    Refill:  3    Unable to tolerate 20 mg due to dizziness    Follow-up: Return in about 3 months (around 03/14/2019) for Medical conditions.       Charlott Rakes, MD, FAAFP. [facility], [place]   12/12/2018, 9:28 AM

## 2018-12-13 LAB — URINALYSIS, MICROSCOPIC ONLY: Casts: NONE SEEN /lpf

## 2019-01-01 ENCOUNTER — Telehealth: Payer: Self-pay | Admitting: Family Medicine

## 2019-01-01 NOTE — Telephone Encounter (Signed)
Will route to PCP for review. 

## 2019-01-01 NOTE — Telephone Encounter (Signed)
Patient called stating that she was prescribed rivaroxaban (XARELTO) 10 MG TABS tablet  And the 10mg  is not helping her. She started taking 2 tablets and it started helping her. Patient would like for her PCP to write her an Rx for 20mg  instead of 10mg . Patient also stated that she is anemic and would like for her iron levels to be tested. Patient also stated she does not login to her MyChart and prefers to receive a call. Please f/u.

## 2019-01-01 NOTE — Telephone Encounter (Signed)
Can you please review my last note for this patient.  She came into the office complaining of bubbles in her urine and requesting reduction of Xarelto from 20 mg down to 10 mg due to her symptoms.  There is a discrepancy between her current request and what she requested at her last office. Please schedule a virtual visit for her. Thanks.

## 2019-01-04 NOTE — Telephone Encounter (Signed)
She needs a virtual visit with me. I will not make changes over telephone calls as she was very adamant at her visit and also complained about previous meds at visit with Levada Dy. I will need to speak with her during a visit. Thank you.

## 2019-01-04 NOTE — Telephone Encounter (Signed)
Patient was called and she states that the 20MG  is helping her better than the 10MG . Patient states that as long as she does not take them the same time as her Vitamins she is ok.   Patient is requesting a refill on 20MG  Xarelto.

## 2019-01-05 MED FILL — XARELTO 20 MG TABLET: 20 | 30 days supply | Qty: 30 | Fill #1

## 2019-01-05 NOTE — Telephone Encounter (Signed)
Patient was called and a voicemail was left informing patient to return phone call. 

## 2019-01-09 NOTE — Telephone Encounter (Signed)
Patient called and said she went up to 20 on the xarelto and it is working.

## 2019-01-09 NOTE — Telephone Encounter (Signed)
Please refer to my message below to Leslie Cross.  She would need to keep her telehealth appointment with me to receive any refills from me due to the information below.  Thank you

## 2019-01-10 ENCOUNTER — Ambulatory Visit: Payer: Self-pay | Admitting: Family Medicine

## 2019-01-22 ENCOUNTER — Emergency Department (HOSPITAL_COMMUNITY)
Admission: EM | Admit: 2019-01-22 | Discharge: 2019-01-22 | Disposition: A | Discharge status: Home / Self Care | Payer: Self-pay | Attending: Emergency Medicine | Admitting: Emergency Medicine

## 2019-01-22 ENCOUNTER — Encounter (HOSPITAL_COMMUNITY): Payer: Self-pay | Admitting: Emergency Medicine

## 2019-01-22 ENCOUNTER — Emergency Department (HOSPITAL_BASED_OUTPATIENT_CLINIC_OR_DEPARTMENT_OTHER): Payer: Self-pay

## 2019-01-22 ENCOUNTER — Other Ambulatory Visit: Payer: Self-pay

## 2019-01-22 DIAGNOSIS — Z86718 Personal history of other venous thrombosis and embolism: Secondary | ICD-10-CM | POA: Insufficient documentation

## 2019-01-22 DIAGNOSIS — Z7901 Long term (current) use of anticoagulants: Secondary | ICD-10-CM | POA: Insufficient documentation

## 2019-01-22 DIAGNOSIS — M7989 Other specified soft tissue disorders: Secondary | ICD-10-CM

## 2019-01-22 DIAGNOSIS — R609 Edema, unspecified: Secondary | ICD-10-CM

## 2019-01-22 DIAGNOSIS — D649 Anemia, unspecified: Secondary | ICD-10-CM | POA: Insufficient documentation

## 2019-01-22 LAB — I-STAT CHEM 8, ED
BUN: 7 mg/dL — ABNORMAL LOW (ref 8–23)
Calcium, Ion: 1.17 mmol/L (ref 1.15–1.40)
Chloride: 105 mmol/L (ref 98–111)
Creatinine, Ser: 0.8 mg/dL (ref 0.44–1.00)
Glucose, Bld: 94 mg/dL (ref 70–99)
HCT: 24 % — ABNORMAL LOW (ref 36.0–46.0)
Hemoglobin: 8.2 g/dL — ABNORMAL LOW (ref 12.0–15.0)
Potassium: 3.8 mmol/L (ref 3.5–5.1)
Sodium: 139 mmol/L (ref 135–145)
TCO2: 27 mmol/L (ref 22–32)

## 2019-01-22 NOTE — Care Management (Signed)
ED CM received consult from EDP to assist with OP follow up, CM reviewed patient's record and  met with patient  In HB11 patient is active with [place], Dr. Margarita Rana. CM discussed contacting the clinic's CM to assist with arranging a follow up E-visit, patient is agreeable. Will update EDP

## 2019-01-22 NOTE — ED Provider Notes (Signed)
[facility] EMERGENCY DEPARTMENT Provider Note   CSN: [id number] Arrival date & time: 01/22/19  1231     History   Chief Complaint Chief Complaint  Patient presents with   Leg Swelling    Leslie AURI Cross is a 68 y.o. female.     Leslie   Patient presents with concern of dyspnea with exertion and right leg swelling. Patient has a history of DVT, no history of cardiac disease. She is a smoker. She states that she is generally well, generally active, working as a Clinical research associate, moving all ladders throughout the day. She notes that over the past few weeks she has noticed swelling in the right leg, worse after the day, better after resting overnight. At some point during this illness she has also noticed dyspnea with exertion. She notes that beyond her history of DVT she has a history of anemia, kidney stones. During this illness patient increased her dose of Xarelto, but notes that her symptoms have been persistent. No chest pain, no abdominal pain, no syncope.  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 No pertinent surgical history.   OB History   No obstetric history on file.      Home Medications    Prior to Admission medications   Medication Sig Start Date End Date Taking? Authorizing Provider  rivaroxaban (XARELTO) 10 MG TABS tablet Take 1 tablet (10 mg total) by mouth daily with supper. Patient taking differently: Take 20 mg by mouth daily with supper.  12/12/18  Yes Charlott Rakes, MD    Family History No family history on file.  Social History Social History   Tobacco Use   Smoking  status: Current Every Day Smoker    Packs/day: 0.50    Types: Cigarettes   Smokeless tobacco: Never Used  Substance Use Topics   Alcohol use: No   Drug use: No     Allergies   Cephalexin, Erythromycin base, Other, and Penicillins   Review of Systems Review of Systems  Constitutional:       Per Leslie, otherwise negative  HENT:       Per Leslie, otherwise negative  Respiratory:       Per Leslie, otherwise negative  Cardiovascular:       Per Leslie, otherwise negative  Gastrointestinal: Negative for vomiting.  Endocrine:       Negative aside from Leslie  Genitourinary:       Neg aside from Leslie   Musculoskeletal:       Per Leslie, otherwise negative  Skin: Negative.   Neurological: Negative for syncope.  Hematological:       Anemia, pica     Physical Exam Updated Vital Signs BP (!) 108/40 (BP Location: Right Arm)    Pulse 79    Temp 98.5 F (36.9 C) (Oral)    Resp 18    SpO2 98%   Physical Exam Vitals signs and nursing note reviewed.  Constitutional:      General: She is not in acute distress.    Appearance: She is well-developed.  HENT:     Head: Normocephalic and atraumatic.  Eyes:  Conjunctiva/sclera: Conjunctivae normal.  Cardiovascular:     Rate and Rhythm: Normal rate and regular rhythm.  Pulmonary:     Effort: Pulmonary effort is normal. No respiratory distress.     Breath sounds: Normal breath sounds. No stridor.  Abdominal:     General: There is no distension.  Musculoskeletal:        General: No deformity.     Right lower leg: Edema present.     Left lower leg: No edema.     Comments: Minimally appreciable LE edema, no ttp  Skin:    General: Skin is warm and dry.  Neurological:     Mental Status: She is alert and oriented to person, place, and time.     Cranial Nerves: No cranial nerve deficit.      ED Treatments / Results  Labs (all labs ordered are listed, but only abnormal results are displayed) Labs Reviewed  I-STAT CHEM 8, ED - Abnormal;  Notable for the following components:      Result Value   BUN 7 (*)    Hemoglobin 8.2 (*)    HCT 24.0 (*)    All other components within normal limits    EKG None  Radiology Vas [place] Lower Extremity Venous (dvt) (only Mc & Wl 7a-7p)  Result Date: 01/22/2019  Lower Venous Study Indications: Edema.  Comparison Study: 08/25/18 previous Performing Technologist: Abram Sander RVS  Examination Guidelines: A complete evaluation includes B-mode imaging, spectral Doppler, color Doppler, and power Doppler as needed of all accessible portions of each vessel. Bilateral testing is considered an integral part of a complete examination. Limited examinations for reoccurring indications may be performed as noted.  +---------+---------------+---------+-----------+----------+--------------+  RIGHT     Compressibility Phasicity Spontaneity Properties Thrombus Aging  +---------+---------------+---------+-----------+----------+--------------+  CFV       Full            Yes                                              +---------+---------------+---------+-----------+----------+--------------+  SFJ       Full                                                             +---------+---------------+---------+-----------+----------+--------------+  FV Prox   Full                                                             +---------+---------------+---------+-----------+----------+--------------+  FV Mid    Full                                                             +---------+---------------+---------+-----------+----------+--------------+  FV Distal Full                                                             +---------+---------------+---------+-----------+----------+--------------+  PFV       Full                                                             +---------+---------------+---------+-----------+----------+--------------+  POP       Full            Yes       Yes                                     +---------+---------------+---------+-----------+----------+--------------+  PTV       Full                                                             +---------+---------------+---------+-----------+----------+--------------+  PERO      Full                                                             +---------+---------------+---------+-----------+----------+--------------+   +----+---------------+---------+-----------+----------+--------------+  LEFT Compressibility Phasicity Spontaneity Properties Thrombus Aging  +----+---------------+---------+-----------+----------+--------------+  CFV  Full            Yes       Yes                                    +----+---------------+---------+-----------+----------+--------------+     Summary: Right: There is no evidence of deep vein thrombosis in the lower extremity. No cystic structure found in the popliteal fossa. Left: No evidence of common femoral vein obstruction.  *See table(s) above for measurements and observations. Electronically signed by Servando Snare MD on 01/22/2019 at 5:01:53 PM.    Final     Procedures Procedures (including critical care time)  Medications Ordered in ED Medications - No data to display   Initial Impression / Assessment and Plan / ED Course  I have reviewed the triage vital signs and the nursing notes.  Pertinent labs & imaging results that were available during my care of the patient were reviewed by me and considered in my medical decision making (see chart for details).        5:51 PM On repeat exam the patient is in no distress, is awake, alert.  I we discussed her hemoglobin value. She notes that she has had hematuria for some time, has history of kidney stones, as above. She has no ongoing complaints, but given description of some dyspnea, as well as lower extremity swelling, she will require outpatient follow-up. I discussed her case with our case management team to ensure that this is possible. Without other  alarming findings, including chest pain, low suspicion for atypical ACS, no evidence for CHF given the absence of any edema anywhere, clear breath sounds. Patient discharged in stable condition with close outpatient follow-up.  Final Clinical Impressions(s) / ED  Diagnoses   Final diagnoses:  Anemia, unspecified type  Leg swelling     Carmin Muskrat, MD 01/22/19 [phone]

## 2019-01-22 NOTE — ED Triage Notes (Signed)
Pt with hx of dvt in July, on 10mg  xarelto, states R calf began swelling 1.5 weeks ago, states she increased her xarelto back to 20mg  lke she had been on originally. States she contacted her doctor but has nto heard back and does not feel like the increase in her xarelto is helping.

## 2019-01-22 NOTE — ED Notes (Signed)
PA Ford made aware of patient, this RN inquired if dvt study needs to be ordered prior to pt seeing a provider, PA Phylliss Bob was also made aware of patient and placed dvt study order.

## 2019-01-22 NOTE — Progress Notes (Signed)
Lower extremity venous has been completed.   Preliminary results in CV Proc.   Abram Sander 01/22/2019 1:58 PM

## 2019-01-22 NOTE — Discharge Instructions (Signed)
Please be sure to follow-up as advised.  Return here for concerning changes in your condition.  Your lower extremity swelling is likely due to your prior DVT, and subsequent changes in the way that your body returns fluid through both the vascular and lymphatic system to your heart.  Compression stockings, available at a pharmacy, should be beneficial to minimize these changes.

## 2019-01-22 NOTE — ED Notes (Signed)
Pt verbalized understanding of d/c instructions and has no further questions, VSS, NAD.  

## 2019-01-30 NOTE — Progress Notes (Signed)
Patient ID: Leslie Cross, female   DOB: [birth year], 68 y.o.   MRN: [id number]  Virtual Visit via Telephone Note  I connected with Leslie Cross on 01/31/19 at  9:50 AM EST by telephone and verified that I am speaking with the correct person using two identifiers.   I discussed the limitations, risks, security and privacy concerns of performing an evaluation and management service by telephone and the availability of in person appointments. I also discussed with the patient that there may be a patient responsible charge related to this service. The patient expressed understanding and agreed to proceed.  Patient location:  home My Location:  [facility] office Persons on the call:  Me and the patient   History of Present Illness: After ED visit 01/22/2019 for leg pain.  The pain in her leg has improved.  She does complain of severe fatigue.  I discussed her anemia with her and she adamantly refuses GI work up.  She is not taking any iron.  She does complain of pl]=ica.  She says her daughter had her intestines punctured during colonoscopy.  Another daughter died of cancer so she is not willing to undergo work up for anemia.  She denies melena/hematochezia.  Appetite is good.    From ED visit: Patient presents with concern of dyspnea with exertion and right leg swelling. Patient has a history of DVT, no history of cardiac disease. She is a smoker. She states that she is generally well, generally active, working as a Clinical research associate, moving all ladders throughout the day. She notes that over the past few weeks she has noticed swelling in the right leg, worse after the day, better after resting overnight. At some point during this illness she has also noticed dyspnea with exertion. She notes that beyond her history of DVT she has a history of anemia, kidney stones. During this illness patient increased her dose of Xarelto, but notes that her symptoms have been persistent. No chest pain, no abdominal pain,  no syncope.  On repeat exam the patient is in no distress, is awake, alert.  I we discussed her hemoglobin value. She notes that she has had hematuria for some time, has history of kidney stones, as above. She has no ongoing complaints, but given description of some dyspnea, as well as lower extremity swelling, she will require outpatient follow-up. I discussed her case with our case management team to ensure that this is possible. Without other alarming findings, including chest pain, low suspicion for atypical ACS, no evidence for CHF given the absence of any edema anywhere, clear breath sounds. Patient discharged in stable condition with close outpatient follow-up.    Observations/Objective:  NAD   Assessment and Plan: 1. Recurrent acute deep vein thrombosis (DVT) of lower extremity, unspecified laterality (HCC) Continue current regimen. - rivaroxaban (XARELTO) 10 MG TABS tablet; Take 1 tablet (10 mg total) by mouth daily with supper.  Dispense: 30 tablet; Refill: 3  2. Anemia, unspecified type Patient refuses GI referral(see above) - ferrous sulfate 324 MG TBEC; Take 1 tablet (324 mg total) by mouth daily with breakfast.  Dispense: 100 tablet; Refill: 1  3. Hospital discharge follow-up Doing well other than fatigue RFd meds    Follow Up Instructions: See PCP 03/16/19 as planned   I discussed the assessment and treatment plan with the patient. The patient was provided an opportunity to ask questions and all were answered. The patient agreed with the plan and demonstrated an understanding of the instructions.  The patient was advised to call back or seek an in-person evaluation if the symptoms worsen or if the condition fails to improve as anticipated.  I provided 11 minutes of non-face-to-face time during this encounter.   Freeman Caldron, PA-C

## 2019-01-31 ENCOUNTER — Ambulatory Visit: Payer: Self-pay | Attending: Family Medicine | Admitting: Physician Assistant

## 2019-01-31 ENCOUNTER — Other Ambulatory Visit: Payer: Self-pay

## 2019-01-31 DIAGNOSIS — D649 Anemia, unspecified: Secondary | ICD-10-CM

## 2019-01-31 DIAGNOSIS — Z09 Encounter for follow-up examination after completed treatment for conditions other than malignant neoplasm: Secondary | ICD-10-CM

## 2019-01-31 DIAGNOSIS — I82409 Acute embolism and thrombosis of unspecified deep veins of unspecified lower extremity: Secondary | ICD-10-CM

## 2019-01-31 MED ORDER — FERROUS SULFATE 324 MG PO TBEC: 324.0000 mg | DELAYED_RELEASE_TABLET | Freq: Every day | ORAL | 1 refills | Status: DC

## 2019-01-31 MED ORDER — RIVAROXABAN 10 MG PO TABS: 10.0000 mg | ORAL_TABLET | Freq: Every day | ORAL | 3 refills | Status: DC

## 2019-01-31 MED FILL — XARELTO 10 MG TABLET: 10 | 30 days supply | Qty: 30 | Fill #0

## 2019-02-26 MED FILL — XARELTO 10 MG TABLET: 10 | 30 days supply | Qty: 30 | Fill #1

## 2019-03-14 ENCOUNTER — Other Ambulatory Visit: Payer: Self-pay

## 2019-03-14 ENCOUNTER — Ambulatory Visit: Payer: Self-pay | Attending: Family Medicine | Admitting: Family Medicine

## 2019-03-14 ENCOUNTER — Telehealth: Payer: Self-pay | Admitting: Family Medicine

## 2019-03-14 DIAGNOSIS — L659 Nonscarring hair loss, unspecified: Secondary | ICD-10-CM

## 2019-03-14 DIAGNOSIS — R634 Abnormal weight loss: Secondary | ICD-10-CM

## 2019-03-14 DIAGNOSIS — I2699 Other pulmonary embolism without acute cor pulmonale: Secondary | ICD-10-CM

## 2019-03-14 DIAGNOSIS — R140 Abdominal distension (gaseous): Secondary | ICD-10-CM

## 2019-03-14 DIAGNOSIS — N200 Calculus of kidney: Secondary | ICD-10-CM

## 2019-03-14 NOTE — Progress Notes (Signed)
Virtual Visit via Telephone Note  I connected with Leslie Cross, on 03/14/2019 at 8:44 AM by telephone due to the COVID-19 pandemic and verified that I am speaking with the correct person using two identifiers.   Consent: I discussed the limitations, risks, security and privacy concerns of performing an evaluation and management service by telephone and the availability of in person appointments. I also discussed with the patient that there may be a patient responsible charge related to this service. The patient expressed understanding and agreed to proceed.   Location of Patient: Home  Location of Provider: Clinic   Persons participating in Telemedicine visit: Aurianna Camuso [place]-CMA Dr. 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  Unable to tolerate full dose of Xarelto due to dizziness and is currently on 10 mg.   Complains of hair loss and weight loss.Sometimes has diarrhea. Also complains of gas in her abdomen after she started Xarelto but no abdominal pain, nausea vomiting. Also loosing weight. Can only afford to eat once daily due to financial constraints and she has been that way for a while. States she has lost 3-5 lbs in 3 months.  She is attributing all the symptoms to Xarelto. Currently on Xarelto 10mg   Also complains she has a kidney stone and is wondering what she can do about it. IMPRESSION: Tiny nonobstructing RIGHT renal calculus.  Distal colonic diverticulosis without evidence of diverticulitis.  No definite acute intra-abdominal or intrapelvic abnormalities.  Aortic Atherosclerosis (ICD10-I70.0). Past Medical History:  Diagnosis Date  . Recurrent acute deep  vein thrombosis (DVT) of lower extremity ([place]) 12/12/2018   Allergies  Allergen Reactions  . Cephalexin Anaphylaxis  . Erythromycin Base Anaphylaxis  . Other Other (See Comments)    All Cillins, Mycins  No meat  . Penicillins Other (See Comments)    Did it involve swelling of the face/tongue/throat, SOB, or low BP? Unknown Did it involve sudden or severe rash/hives, skin peeling, or any reaction on the inside of your mouth or nose? Unknown Did you need to seek medical attention at a hospital or doctor's office? Unknown When did it last happen?10 years If all above answers are "NO", may proceed with cephalosporin use.    Current Outpatient Medications on File Prior to Visit  Medication Sig Dispense Refill  . ferrous sulfate 324 MG TBEC Take 1 tablet (324 mg total) by mouth daily with breakfast. 100 tablet 1  . rivaroxaban (XARELTO) 10 MG TABS tablet Take 1 tablet (10 mg total) by mouth daily with supper. 30 tablet 3   No current facility-administered medications on file prior to visit.    Observations/Objective: Awake, alert, oriented x3 Not in acute distress  Assessment and Plan: 1. Alopecia We will evaluate for underlying thyroid abnormalities Nutritional deficiencies could account for alopecia as financial constraints is a major barrier to obtaining meals  2. Weight loss Unable to afford 3 square meals-limited by finances We will exclude thyroid disorder Counseled that this is unlikely due to Xarelto - T4, free; Future - TSH; Future  3. Gassiness Need to exclude H. pylori gastritis - H. pylori breath test; Future  4. Renal calculi Advised to increase oral hydration  5.  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 The patient was provided an  opportunity to ask questions and all were answered. The patient agreed with the plan and demonstrated an understanding of the instructions.   The patient was advised to call back or seek an in-person evaluation if the symptoms worsen or if the condition fails to improve as anticipated.     I provided 20 minutes total of non-face-to-face time during this encounter including median intraservice time, reviewing previous notes, investigations, ordering medications, medical decision making, coordinating care and patient verbalized understanding at the end of the visit.     Charlott Rakes, MD, FAAFP. [facility] and [place], [place]   03/14/2019, 8:44 AM

## 2019-03-14 NOTE — Telephone Encounter (Signed)
Patient forgot to inform Dr. Margarita Rana that she was taking iron supplements. She also stated her other doctor would like her to be checked for anemia again.

## 2019-03-14 NOTE — Progress Notes (Signed)
Patient has been called and DOB has been verified. Patient has been screened and transferred to PCP to start phone visit.  Losing weight, and hair due to Xarelto.Also having bad gas   Still has kidney stones.

## 2019-03-15 ENCOUNTER — Other Ambulatory Visit: Payer: Self-pay

## 2019-03-15 DIAGNOSIS — D649 Anemia, unspecified: Secondary | ICD-10-CM

## 2019-03-15 NOTE — Telephone Encounter (Signed)
Can we add on a lab to test her for anemia, she has a lab appointment on 03/21/19

## 2019-03-15 NOTE — Telephone Encounter (Signed)
Order has been added 

## 2019-03-15 NOTE — Telephone Encounter (Signed)
Sure

## 2019-03-19 ENCOUNTER — Ambulatory Visit: Payer: Self-pay | Attending: Internal Medicine

## 2019-03-19 DIAGNOSIS — Z20822 Contact with and (suspected) exposure to covid-19: Secondary | ICD-10-CM | POA: Insufficient documentation

## 2019-03-20 LAB — NOVEL CORONAVIRUS, NAA: SARS-CoV-2, NAA: NOT DETECTED

## 2019-03-21 ENCOUNTER — Other Ambulatory Visit: Payer: Self-pay

## 2019-03-21 ENCOUNTER — Ambulatory Visit: Payer: Self-pay | Attending: Family Medicine

## 2019-03-21 DIAGNOSIS — D649 Anemia, unspecified: Secondary | ICD-10-CM

## 2019-03-21 DIAGNOSIS — R140 Abdominal distension (gaseous): Secondary | ICD-10-CM

## 2019-03-21 DIAGNOSIS — R634 Abnormal weight loss: Secondary | ICD-10-CM

## 2019-03-22 LAB — IRON,TIBC AND FERRITIN PANEL
Ferritin: 45 ng/mL (ref 15–150)
Iron Saturation: 28 % (ref 15–55)
Iron: 86 ug/dL (ref 27–139)
Total Iron Binding Capacity: 308 ug/dL (ref 250–450)
UIBC: 222 ug/dL (ref 118–369)

## 2019-03-22 LAB — TSH: TSH: 1.58 u[IU]/mL (ref 0.450–4.500)

## 2019-03-22 LAB — T4, FREE: Free T4: 0.99 ng/dL (ref 0.82–1.77)

## 2019-03-27 MED FILL — XARELTO 10 MG TABLET: 10 | 30 days supply | Qty: 30 | Fill #2

## 2019-03-28 ENCOUNTER — Other Ambulatory Visit: Payer: Self-pay

## 2019-03-28 ENCOUNTER — Ambulatory Visit: Payer: Self-pay | Attending: Family Medicine

## 2019-04-11 ENCOUNTER — Telehealth: Payer: Self-pay | Admitting: Pharmacist

## 2019-04-11 MED FILL — XARELTO 10 MG TABLET: 10 | 30 days supply | Qty: 30 | Fill #2

## 2019-04-11 NOTE — Telephone Encounter (Signed)
Leslie Cross, can you please relay this message to the patient? If she is unable to obtain Xarelto we will need to switch her to Coumadin and she would need an office visit for this. Thanks

## 2019-04-11 NOTE — Telephone Encounter (Signed)
Received message from our pharmacy here. This patient is on Xarelto indefinitely for recurrent VTE. She has been non-compliant with patient assistance and we have no samples left to give. Leslie Cross has talked to her and requested that she complete her required applications but she has not done so. We wanted to make you aware that we are unable to continue providing Xarelto for her.

## 2019-04-12 NOTE — Telephone Encounter (Signed)
Patient was called and a voicemail was left informing patient to return phone call. 

## 2019-05-14 MED FILL — XARELTO 10 MG TABLET: 10 | 30 days supply | Qty: 30 | Fill #3

## 2019-06-14 ENCOUNTER — Telehealth: Payer: Self-pay

## 2019-06-14 NOTE — Telephone Encounter (Signed)
Patient was called and a voicemail was left informing patient that we have received her paperwork and she will be called once paperwork is ready for pick up.

## 2019-06-14 NOTE — Telephone Encounter (Signed)
Pt states she mailed in a application for Xarelto on Friday for the provider to fill out for patient assistance. Pt is wanting to know if form has been recieved

## 2019-08-13 ENCOUNTER — Telehealth: Payer: Self-pay | Admitting: Family Medicine

## 2019-08-13 DIAGNOSIS — I82409 Acute embolism and thrombosis of unspecified deep veins of unspecified lower extremity: Secondary | ICD-10-CM

## 2019-08-13 MED ORDER — RIVAROXABAN 10 MG PO TABS: 10.0000 mg | ORAL_TABLET | Freq: Every day | ORAL | 0 refills | Status: DC

## 2019-08-13 NOTE — Telephone Encounter (Signed)
Will send rx for one month. Patient needs an appointment.

## 2019-08-13 NOTE — Telephone Encounter (Signed)
Needs Xarelto 10 mg sent to [facility] in [place] on [address]. Needs it Wednesday the 30th

## 2019-09-12 ENCOUNTER — Other Ambulatory Visit: Payer: Self-pay | Admitting: Family Medicine

## 2019-09-12 DIAGNOSIS — I82409 Acute embolism and thrombosis of unspecified deep veins of unspecified lower extremity: Secondary | ICD-10-CM

## 2019-09-12 NOTE — Telephone Encounter (Signed)
Requested medication (s) are due for refill today: yes  Requested medication (s) are on the active medication list: yes  Last refill:  08/13/2019  Future visit scheduled: no  Notes to clinic:  vm was left for patient to contact office to schedule    Requested Prescriptions  Pending Prescriptions Disp Refills   XARELTO 10 MG TABS tablet [Pharmacy Med Name: Xarelto 10 MG Oral Tablet] 30 tablet 0    Sig: TAKE 1 TABLET BY MOUTH ONCE DAILY WITH  SUPPER  (MUST  MAKE  AN  APPOINTMENT)      Hematology: Anticoagulants - rivaroxaban Failed - 09/12/2019  9:48 AM      Failed - ALT in normal range and within 180 days    No results found for: ALT, LABALT, POCALT        Failed - AST in normal range and within 180 days    No results found for: POCAST, AST        Failed - HCT in normal range and within 360 days    HCT  Date Value Ref Range Status  01/22/2019 24.0 (L) 36 - 46 % Final   Hematocrit  Date Value Ref Range Status  11/16/2018 35.1 34.0 - 46.6 % Final          Failed - HGB in normal range and within 360 days    Hemoglobin  Date Value Ref Range Status  01/22/2019 8.2 (L) 12.0 - 15.0 g/dL Final  11/16/2018 11.7 11.1 - 15.9 g/dL Final          Passed - Cr in normal range and within 360 days    Creatinine, Ser  Date Value Ref Range Status  01/22/2019 0.80 0.44 - 1.00 mg/dL Final          Passed - PLT in normal range and within 360 days    Platelets  Date Value Ref Range Status  11/16/2018 394 150 - 450 x10E3/uL Final          Passed - Valid encounter within last 12 months    Recent Outpatient Visits           6 months ago [place], Charlane Ferretti, MD   7 months ago Anemia, unspecified type   [place], [place]   9 months ago [place], Charlane Ferretti, MD   10 months ago Hematuria, unspecified type   [place], [place], [place]   1 year ago Bilateral pulmonary embolism [facility])   [facility] Charlott Rakes, MD

## 2019-09-12 NOTE — Telephone Encounter (Signed)
Please advise.   Copied from [place] [id number]. Topic: General - Other >> Sep 12, 2019 10:01 AM Rainey Pines A wrote: Patient would like a callback from [place] in regards to getting her xarelto medication . Advised patient she need to make an appointment patient refused and still wants medication. Please advise

## 2019-09-12 NOTE — Telephone Encounter (Signed)
Patient calling back to check status of this request.

## 2019-09-13 ENCOUNTER — Other Ambulatory Visit: Payer: Self-pay | Admitting: Family Medicine

## 2019-09-13 DIAGNOSIS — I82409 Acute embolism and thrombosis of unspecified deep veins of unspecified lower extremity: Secondary | ICD-10-CM

## 2019-09-13 NOTE — Telephone Encounter (Signed)
Requested  medications are  due for refill today yes  Requested medications are on the active medication list yes  Last refill 6/28  Last visit Jan 2021  Future visit scheduled NO  Notes to clinic already had a curtesy refill, no visit scheduled.

## 2019-09-14 NOTE — Telephone Encounter (Signed)
XERALTO pt uses [facility]. Please fill enough until she can be seen 11/05/19.

## 2019-09-14 NOTE — Telephone Encounter (Signed)
Leslie Cross,   We have asked this patient to schedule an appointment with Dr. Margarita Rana before we can refill this medication. Are we able to refill without scheduling an appointment? She hasn't been seen this year.

## 2019-11-05 ENCOUNTER — Ambulatory Visit: Payer: Medicare Other | Attending: Family Medicine | Admitting: Family Medicine

## 2019-11-05 ENCOUNTER — Encounter: Payer: Self-pay | Admitting: Family Medicine

## 2019-11-05 VITALS — Ht 62.0 in

## 2019-11-05 DIAGNOSIS — I82409 Acute embolism and thrombosis of unspecified deep veins of unspecified lower extremity: Secondary | ICD-10-CM | POA: Diagnosis not present

## 2019-11-05 DIAGNOSIS — Z13228 Encounter for screening for other metabolic disorders: Secondary | ICD-10-CM | POA: Diagnosis not present

## 2019-11-05 DIAGNOSIS — Z1159 Encounter for screening for other viral diseases: Secondary | ICD-10-CM

## 2019-11-05 MED ORDER — RIVAROXABAN 10 MG PO TABS: ORAL_TABLET | ORAL | 6 refills | Status: DC

## 2019-11-05 NOTE — Progress Notes (Signed)
Virtual Visit via Telephone Note  I connected with Leslie Cross, on 11/05/2019 at 4:10 PM by telephone due to the COVID-19 pandemic and verified that I am speaking with the correct person using two identifiers.   Consent: I discussed the limitations, risks, security and privacy concerns of performing an evaluation and management service by telephone and the availability of in person appointments. I also discussed with the patient that there may be a patient responsible charge related to this service. The patient expressed understanding and agreed to proceed.   Location of Patient: Home  Location of Provider: Clinic   Persons participating in Telemedicine visit: Lashawn Bromwell Farrington-CMA Dr. 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  Unable to tolerate full dose of Xarelto due to dizziness and is currently on 10 mg.  Still has Hematuria which she attributes to her kidney stone.  She denies presence of flank pain, urinary frequency. Her weight is at 97 lbs; her weight has been a concern for her at her last visit which she had thought was secondary to Xarelto. Her eating pattern is the same; she eats whenever she can afford to.  Still smoking 10-15 cig/day; she is not ready to quit smoking Discussed the need for healthcare maintenance including mammogram, colonoscopy, bone density scan, lung cancer screening, flu shot and she declines all of them.  Past Medical History:  Diagnosis Date  . Recurrent acute deep vein thrombosis (DVT) of lower extremity ([place]) 12/12/2018   Allergies  Allergen Reactions  . Cephalexin Anaphylaxis  . Erythromycin Base Anaphylaxis  . Other Other (See Comments)    All Cillins,  Mycins  No meat  . Penicillins Other (See Comments)    Did it involve swelling of the face/tongue/throat, SOB, or low BP? Unknown Did it involve sudden or severe rash/hives, skin peeling, or any reaction on the inside of your mouth or nose? Unknown Did you need to seek medical attention at a hospital or doctor's office? Unknown When did it last happen?10 years If all above answers are "NO", may proceed with cephalosporin use.    Current Outpatient Medications on File Prior to Visit  Medication Sig Dispense Refill  . ferrous sulfate 324 MG TBEC Take 1 tablet (324 mg total) by mouth daily with breakfast. 100 tablet 1  . XARELTO 10 MG TABS tablet TAKE 1 TABLET BY MOUTH ONCE DAILY WITH  SUPPER  (MUST  MAKE  AN  APPOINTMENT) 30 tablet 1   No current facility-administered medications on file prior to visit.    Observations/Objective: Awake, alert, oriented x3 Not in acute distress  Assessment and Plan: 1. Recurrent acute deep vein thrombosis (DVT) of lower extremity, unspecified laterality ([place]) She is currently on lifelong anticoagulation - Basic Metabolic Panel; Future - Lipid panel; Future - rivaroxaban (XARELTO) 10 MG TABS tablet; TAKE 1 TABLET BY MOUTH ONCE DAILY WITH  SUPPER  Dispense: 30 tablet; Refill: 6 - CBC with Differential/Platelet; Future  2. Screening for metabolic disorder Lipid panel ordered  3. Need for hepatitis C screening test - HCV RNA quant rflx ultra or genotyp([facility])   Follow Up Instructions: 6 months   I discussed the assessment and treatment plan with the patient. The patient was provided an opportunity to ask questions and  all were answered. The patient agreed with the plan and demonstrated an understanding of the instructions.   The patient was advised to call back or seek an in-person evaluation if the symptoms worsen or if the condition fails to improve as anticipated.     I provided 11 minutes total of non-face-to-face time  during this encounter including median intraservice time, reviewing previous notes, investigations, ordering medications, medical decision making, coordinating care and patient verbalized understanding at the end of the visit.     Charlott Rakes, MD, FAAFP. [facility], [place]   11/05/2019, 4:10 PM

## 2019-11-12 ENCOUNTER — Other Ambulatory Visit: Payer: Self-pay | Admitting: Family Medicine

## 2019-11-12 ENCOUNTER — Telehealth: Payer: Self-pay | Admitting: Family Medicine

## 2019-11-12 DIAGNOSIS — I82409 Acute embolism and thrombosis of unspecified deep veins of unspecified lower extremity: Secondary | ICD-10-CM

## 2019-11-12 NOTE — Telephone Encounter (Signed)
Patient is calling to request a note for work for her 11/05/19 3:50p. Patient took off work. Patient is able to pick up the note when she comes in this Wednesday 11/14/19 at 8:30a. Please advise CB- [phone]

## 2019-11-12 NOTE — Telephone Encounter (Signed)
Medication: rivaroxaban (XARELTO) 10 MG TABS tablet [[id number]] Patient had medicaiton refill appt last week meds where not called in  Has the patient contacted their pharmacy? YES (Agent: If no, request that the patient contact the pharmacy for the refill.) (Agent: If yes, when and what did the pharmacy advise?)  Preferred Pharmacy (with phone number or street name): [facility] [address], [place] - [place]  [address] [place] [address]  Phone:  [phone] Fax:  [phone]  Agent: Please be advised that RX refills may take up to 3 business days. We ask that you follow-up with your pharmacy.

## 2019-11-12 NOTE — Telephone Encounter (Signed)
Ordered on 11/05/19 with refills. This request is refused-has been previously taken care of by other.

## 2019-11-13 NOTE — Telephone Encounter (Signed)
Okay to write letter 

## 2019-11-13 NOTE — Telephone Encounter (Signed)
Letter has been written and placed up front for pick up.

## 2019-11-13 NOTE — Telephone Encounter (Signed)
Patient is requesting letter for recent televisit on 11/05/19 she states that she took of work.

## 2019-11-14 ENCOUNTER — Other Ambulatory Visit: Payer: Self-pay

## 2019-11-14 ENCOUNTER — Ambulatory Visit: Payer: Medicare Other | Attending: Family Medicine

## 2019-11-14 DIAGNOSIS — I82409 Acute embolism and thrombosis of unspecified deep veins of unspecified lower extremity: Secondary | ICD-10-CM

## 2019-11-15 LAB — CBC WITH DIFFERENTIAL/PLATELET
Basophils Absolute: 0.1 10*3/uL (ref 0.0–0.2)
Basos: 1 %
EOS (ABSOLUTE): 0.1 10*3/uL (ref 0.0–0.4)
Eos: 1 %
Hematocrit: 37.4 % (ref 34.0–46.6)
Hemoglobin: 12.6 g/dL (ref 11.1–15.9)
Immature Grans (Abs): 0 10*3/uL (ref 0.0–0.1)
Immature Granulocytes: 0 %
Lymphocytes Absolute: 1.6 10*3/uL (ref 0.7–3.1)
Lymphs: 24 %
MCH: 34.7 pg — ABNORMAL HIGH (ref 26.6–33.0)
MCHC: 33.7 g/dL (ref 31.5–35.7)
MCV: 103 fL — ABNORMAL HIGH (ref 79–97)
Monocytes Absolute: 0.5 10*3/uL (ref 0.1–0.9)
Monocytes: 8 %
Neutrophils Absolute: 4.3 10*3/uL (ref 1.4–7.0)
Neutrophils: 66 %
Platelets: 320 10*3/uL (ref 150–450)
RBC: 3.63 x10E6/uL — ABNORMAL LOW (ref 3.77–5.28)
RDW: 12.5 % (ref 11.7–15.4)
WBC: 6.6 10*3/uL (ref 3.4–10.8)

## 2019-11-15 LAB — LIPID PANEL
Chol/HDL Ratio: 4.4 ratio (ref 0.0–4.4)
Cholesterol, Total: 162 mg/dL (ref 100–199)
HDL: 37 mg/dL — ABNORMAL LOW (ref >39)
LDL Chol Calc (NIH): 111 mg/dL — ABNORMAL HIGH (ref 0–99)
Triglycerides: 73 mg/dL (ref 0–149)
VLDL Cholesterol Cal: 14 mg/dL (ref 5–40)

## 2019-11-15 LAB — BASIC METABOLIC PANEL
BUN/Creatinine Ratio: 9 — ABNORMAL LOW (ref 12–28)
BUN: 6 mg/dL — ABNORMAL LOW (ref 8–27)
CO2: 23 mmol/L (ref 20–29)
Calcium: 9.2 mg/dL (ref 8.7–10.3)
Chloride: 103 mmol/L (ref 96–106)
Creatinine, Ser: 0.68 mg/dL (ref 0.57–1.00)
GFR calc Af Amer: 103 mL/min/{1.73_m2} (ref >59)
GFR calc non Af Amer: 90 mL/min/{1.73_m2} (ref >59)
Glucose: 112 mg/dL — ABNORMAL HIGH (ref 65–99)
Potassium: 4.5 mmol/L (ref 3.5–5.2)
Sodium: 139 mmol/L (ref 134–144)

## 2020-02-22 ENCOUNTER — Other Ambulatory Visit: Payer: Medicare Other

## 2020-02-22 DIAGNOSIS — Z20822 Contact with and (suspected) exposure to covid-19: Secondary | ICD-10-CM

## 2020-02-26 LAB — NOVEL CORONAVIRUS, NAA: SARS-CoV-2, NAA: NOT DETECTED

## 2020-05-13 DIAGNOSIS — Z7901 Long term (current) use of anticoagulants: Secondary | ICD-10-CM | POA: Diagnosis not present

## 2020-05-13 DIAGNOSIS — R319 Hematuria, unspecified: Secondary | ICD-10-CM | POA: Diagnosis not present

## 2020-05-13 DIAGNOSIS — Z79899 Other long term (current) drug therapy: Secondary | ICD-10-CM | POA: Diagnosis not present

## 2020-05-13 DIAGNOSIS — K625 Hemorrhage of anus and rectum: Secondary | ICD-10-CM | POA: Diagnosis not present

## 2020-05-13 DIAGNOSIS — D649 Anemia, unspecified: Secondary | ICD-10-CM | POA: Diagnosis not present

## 2020-05-13 DIAGNOSIS — D7589 Other specified diseases of blood and blood-forming organs: Secondary | ICD-10-CM | POA: Diagnosis not present

## 2020-05-13 DIAGNOSIS — I82409 Acute embolism and thrombosis of unspecified deep veins of unspecified lower extremity: Secondary | ICD-10-CM | POA: Diagnosis not present

## 2020-06-06 ENCOUNTER — Other Ambulatory Visit: Payer: Self-pay | Admitting: Family Medicine

## 2020-06-06 DIAGNOSIS — I82409 Acute embolism and thrombosis of unspecified deep veins of unspecified lower extremity: Secondary | ICD-10-CM

## 2020-06-10 ENCOUNTER — Other Ambulatory Visit: Payer: Self-pay | Admitting: Family Medicine

## 2020-06-10 DIAGNOSIS — I82409 Acute embolism and thrombosis of unspecified deep veins of unspecified lower extremity: Secondary | ICD-10-CM

## 2020-06-13 DIAGNOSIS — H2513 Age-related nuclear cataract, bilateral: Secondary | ICD-10-CM | POA: Diagnosis not present

## 2020-06-13 DIAGNOSIS — H5212 Myopia, left eye: Secondary | ICD-10-CM | POA: Diagnosis not present

## 2020-06-16 DIAGNOSIS — E538 Deficiency of other specified B group vitamins: Secondary | ICD-10-CM | POA: Diagnosis not present

## 2020-07-10 DIAGNOSIS — D7589 Other specified diseases of blood and blood-forming organs: Secondary | ICD-10-CM | POA: Diagnosis not present

## 2020-07-10 DIAGNOSIS — E538 Deficiency of other specified B group vitamins: Secondary | ICD-10-CM | POA: Diagnosis not present

## 2020-07-10 DIAGNOSIS — Z7901 Long term (current) use of anticoagulants: Secondary | ICD-10-CM | POA: Diagnosis not present

## 2020-07-10 DIAGNOSIS — K625 Hemorrhage of anus and rectum: Secondary | ICD-10-CM | POA: Diagnosis not present

## 2020-07-22 ENCOUNTER — Telehealth: Payer: Self-pay | Admitting: Hematology and Oncology

## 2020-07-22 NOTE — Telephone Encounter (Signed)
Scheduled appt per 5/27 referral. Pt aware.

## 2020-08-04 DIAGNOSIS — R197 Diarrhea, unspecified: Secondary | ICD-10-CM | POA: Diagnosis not present

## 2020-08-04 DIAGNOSIS — Z86718 Personal history of other venous thrombosis and embolism: Secondary | ICD-10-CM | POA: Diagnosis not present

## 2020-08-04 DIAGNOSIS — D649 Anemia, unspecified: Secondary | ICD-10-CM | POA: Diagnosis not present

## 2020-08-04 DIAGNOSIS — F17210 Nicotine dependence, cigarettes, uncomplicated: Secondary | ICD-10-CM | POA: Diagnosis not present

## 2020-08-04 DIAGNOSIS — R42 Dizziness and giddiness: Secondary | ICD-10-CM | POA: Diagnosis not present

## 2020-08-20 NOTE — Progress Notes (Signed)
[facility] CONSULT NOTE  Patient Care Team: Charlott Rakes, MD as PCP - General (Family Medicine)  CHIEF COMPLAINTS/PURPOSE OF CONSULTATION:  Newly diagnosed vitamin B12 deficiency with macrocytosis  HISTORY OF PRESENTING ILLNESS:  Leslie Cross 70 y.o. female is here because of recent diagnosis of vitamin B12 deficiency with macrocytosis, unable to tolerate B12. Labs on 05/13/20 showed TSH 1.62, RBC 3.87, HGB 13.6, HCT 41.2, and PLT 351. She presents to the clinic today for initial evaluation and discussion of treatment options.  She complains of intolerance to supplements like B12.  She tells me that she feels a tremble in the body and she takes B12.  She does not wish to take any B12 injections.  She is also not been eating much of animal protein.  She says she is intolerant to lots of food items.  Recently when she ate some chicken she had bright red blood per rectum and she attributes this to Xarelto as well.  She has been on blood thinners for the past 2 years for a right lower extremity below-knee DVT.  She is in fact only taking 5 mg of Xarelto.  Her major complaints are dizziness, bright red blood per rectum, cataracts, fatigue, intolerance to foods and supplements.  I reviewed her records extensively and collaborated the history with the patient.  MEDICAL HISTORY:  Past Medical History:  Diagnosis Date   Recurrent acute deep vein thrombosis (DVT) of lower extremity ([place]) 12/12/2018    SURGICAL HISTORY: No past surgical history on file.  SOCIAL HISTORY: Social History   Socioeconomic History   Marital status: Single    Spouse name: Not on file   Number of children: Not on file   Years of education: Not on file   Highest education level: Not on file  Occupational History   Not on file  Tobacco Use   Smoking status: Every Day    Packs/day: 0.50    Pack years: 0.00    Types: Cigarettes   Smokeless tobacco: Never  Vaping Use   Vaping Use: Never  used  Substance and Sexual Activity   Alcohol use: No   Drug use: No   Sexual activity: Not on file  Other Topics Concern   Not on file  Social History Narrative   Not on file   Social Determinants of Health   Financial Resource Strain: Not on file  Food Insecurity: Not on file  Transportation Needs: Not on file  Physical Activity: Not on file  Stress: Not on file  Social Connections: Not on file  Intimate Partner Violence: Not on file    FAMILY HISTORY: No family history on file.  ALLERGIES:  is allergic to cephalexin, erythromycin base, other, and penicillins.  MEDICATIONS:  Current Outpatient Medications  Medication Sig Dispense Refill   ferrous sulfate 324 MG TBEC Take 1 tablet (324 mg total) by mouth daily with breakfast. 100 tablet 1   XARELTO 10 MG TABS tablet TAKE 1 TABLET BY MOUTH ONCE DAILY WITH SUPPER 90 tablet 0   No current facility-administered medications for this visit.    REVIEW OF SYSTEMS:   Constitutional: Denies fevers, chills or abnormal night sweats Eyes: Denies blurriness of vision, double vision or watery eyes Ears, nose, mouth, throat, and face: Denies mucositis or sore throat Respiratory: Denies cough, dyspnea or wheezes Cardiovascular: Denies palpitation, chest discomfort or lower extremity swelling Gastrointestinal:  Denies nausea, heartburn or change in bowel habits Skin: Denies abnormal skin rashes Lymphatics: Denies new lymphadenopathy  or easy bruising Neurological:Denies numbness, tingling or new weaknesses Behavioral/Psych: Mood is stable, no new changes  All other systems were reviewed with the patient and are negative.  PHYSICAL EXAMINATION: ECOG PERFORMANCE STATUS: 1 - Symptomatic but completely ambulatory  Vitals:   08/21/20 0910  BP: 115/83  Pulse: 72  Resp: 18  Temp: 98.3 F (36.8 C)  SpO2: 100%   Filed Weights   08/21/20 0910  Weight: 97 lb (44 kg)      LABORATORY DATA:  I have reviewed the data as listed Lab  Results  Component Value Date   WBC 6.6 11/14/2019   HGB 12.6 11/14/2019   HCT 37.4 11/14/2019   MCV 103 (H) 11/14/2019   PLT 320 11/14/2019   Lab Results  Component Value Date   NA 139 11/14/2019   K 4.5 11/14/2019   CL 103 11/14/2019   CO2 23 11/14/2019    RADIOGRAPHIC STUDIES: I have personally reviewed the radiological reports and agreed with the findings in the report.  ASSESSMENT AND PLAN:  B12 deficiency Lab review: B12 (1/91/4782): 956, folic acid 8.7, hemoglobin 13.6, MCV 106.4 Inability to tolerate any formulation of B12 including pills, sublingual tablets, injections. We discussed about dietary sources of B12. She also potentially could have malabsorption of B12.  I would like to check for pernicious anemia with antiintrinsic factor antibody and antiparietal cell antibody. If she truly has some malabsorption then we may have to consider giving her low-dose B12 replacement therapy and see how she does with the lower dose. If she does not have pernicious anemia then we can consider sublingual B12 supplement with a different brand and a different dosage and to see if she can handle that any better.  Acute deep vein thrombosis (DVT) of right lower extremity ([facility]) Currently on long-term anticoagulation for DVT Her daughter also has blood clot and was diagnosed with antiphospholipid antibody syndrome. I discussed with her that since it has been 30 years since her first blood clot and the fact that she has been on anticoagulation for the past 2 years, we will check for antiphospholipid bodies.  If these are negative then I would recommend discontinuation of Xarelto. Xarelto is also causing her rectal bleeding. I discussed with her about getting a gastroenterology consult but she does not want to do a colonoscopy because of a family member who had a colon perforation from it.  Return to clinic next week for a telephone visit.   All questions were answered. The patient knows  to call the clinic with any problems, questions or concerns.   Rulon Eisenmenger, MD, MPH 08/21/2020    I, Thana Ates, am acting as scribe for Nicholas Lose, MD.  I have reviewed the above documentation for accuracy and completeness, and I agree with the above.

## 2020-08-21 ENCOUNTER — Other Ambulatory Visit: Payer: Self-pay

## 2020-08-21 ENCOUNTER — Inpatient Hospital Stay: Payer: Medicare Other

## 2020-08-21 ENCOUNTER — Inpatient Hospital Stay: Payer: Medicare Other | Attending: Hematology and Oncology | Admitting: Hematology and Oncology

## 2020-08-21 DIAGNOSIS — Z7901 Long term (current) use of anticoagulants: Secondary | ICD-10-CM | POA: Insufficient documentation

## 2020-08-21 DIAGNOSIS — F17210 Nicotine dependence, cigarettes, uncomplicated: Secondary | ICD-10-CM | POA: Diagnosis not present

## 2020-08-21 DIAGNOSIS — Z86718 Personal history of other venous thrombosis and embolism: Secondary | ICD-10-CM | POA: Diagnosis not present

## 2020-08-21 DIAGNOSIS — R42 Dizziness and giddiness: Secondary | ICD-10-CM | POA: Insufficient documentation

## 2020-08-21 DIAGNOSIS — Z79899 Other long term (current) drug therapy: Secondary | ICD-10-CM | POA: Insufficient documentation

## 2020-08-21 DIAGNOSIS — E538 Deficiency of other specified B group vitamins: Secondary | ICD-10-CM

## 2020-08-21 DIAGNOSIS — R5383 Other fatigue: Secondary | ICD-10-CM | POA: Diagnosis not present

## 2020-08-21 DIAGNOSIS — I824Y1 Acute embolism and thrombosis of unspecified deep veins of right proximal lower extremity: Secondary | ICD-10-CM

## 2020-08-21 DIAGNOSIS — K625 Hemorrhage of anus and rectum: Secondary | ICD-10-CM | POA: Insufficient documentation

## 2020-08-21 HISTORY — DX: Deficiency of other specified B group vitamins: E53.8

## 2020-08-21 LAB — CBC WITH DIFFERENTIAL (CANCER CENTER ONLY)
Abs Immature Granulocytes: 0.01 10*3/uL (ref 0.00–0.07)
Basophils Absolute: 0.1 10*3/uL (ref 0.0–0.1)
Basophils Relative: 1 %
Eosinophils Absolute: 0.1 10*3/uL (ref 0.0–0.5)
Eosinophils Relative: 1 %
HCT: 41.2 % (ref 36.0–46.0)
Hemoglobin: 14 g/dL (ref 12.0–15.0)
Immature Granulocytes: 0 %
Lymphocytes Relative: 31 %
Lymphs Abs: 1.6 10*3/uL (ref 0.7–4.0)
MCH: 32.6 pg (ref 26.0–34.0)
MCHC: 34 g/dL (ref 30.0–36.0)
MCV: 95.8 fL (ref 80.0–100.0)
Monocytes Absolute: 0.3 10*3/uL (ref 0.1–1.0)
Monocytes Relative: 7 %
Neutro Abs: 3.1 10*3/uL (ref 1.7–7.7)
Neutrophils Relative %: 60 %
Platelet Count: 302 10*3/uL (ref 150–400)
RBC: 4.3 MIL/uL (ref 3.87–5.11)
RDW: 12.1 % (ref 11.5–15.5)
WBC Count: 5.2 10*3/uL (ref 4.0–10.5)
nRBC: 0 % (ref 0.0–0.2)

## 2020-08-21 LAB — IRON AND TIBC
Iron: 44 ug/dL (ref 41–142)
Saturation Ratios: 14 % — ABNORMAL LOW (ref 21–57)
TIBC: 321 ug/dL (ref 236–444)
UIBC: 277 ug/dL (ref 120–384)

## 2020-08-21 LAB — VITAMIN B12: Vitamin B-12: 159 pg/mL — ABNORMAL LOW (ref 180–914)

## 2020-08-21 LAB — FERRITIN: Ferritin: 36 ng/mL (ref 11–307)

## 2020-08-21 NOTE — Assessment & Plan Note (Signed)
Lab review: B12 (0/80/2233): 612, folic acid 8.7, hemoglobin 13.6, MCV 106.4 Inability to tolerate any formulation of B12 including pills, sublingual tablets, injections. I discussed with her that there is no other formulation of B12 supplementation. We discussed about dietary sources of B12. She also potentially could have malabsorption of B12.  I would like to check for pernicious anemia with antiintrinsic factor antibody and antiparietal cell antibody.

## 2020-08-21 NOTE — Assessment & Plan Note (Signed)
Long-term anticoagulation for DVT

## 2020-08-22 DIAGNOSIS — K625 Hemorrhage of anus and rectum: Secondary | ICD-10-CM | POA: Diagnosis not present

## 2020-08-22 DIAGNOSIS — R1013 Epigastric pain: Secondary | ICD-10-CM | POA: Diagnosis not present

## 2020-08-22 DIAGNOSIS — Z7901 Long term (current) use of anticoagulants: Secondary | ICD-10-CM | POA: Diagnosis not present

## 2020-08-22 DIAGNOSIS — D500 Iron deficiency anemia secondary to blood loss (chronic): Secondary | ICD-10-CM | POA: Diagnosis not present

## 2020-08-22 DIAGNOSIS — R197 Diarrhea, unspecified: Secondary | ICD-10-CM | POA: Diagnosis not present

## 2020-08-22 LAB — LUPUS ANTICOAGULANT PANEL
DRVVT: 49.2 s — ABNORMAL HIGH (ref 0.0–47.0)
PTT Lupus Anticoagulant: 32.1 s (ref 0.0–51.9)

## 2020-08-22 LAB — BETA-2-GLYCOPROTEIN I ABS, IGG/M/A
Beta-2 Glyco I IgG: <9 GPI IgG units (ref 0–20)
Beta-2-Glycoprotein I IgA: <9 GPI IgA units (ref 0–25)
Beta-2-Glycoprotein I IgM: <9 GPI IgM units (ref 0–32)

## 2020-08-22 LAB — DRVVT CONFIRM: dRVVT Confirm: 1.3 ratio — ABNORMAL HIGH (ref 0.8–1.2)

## 2020-08-22 LAB — INTRINSIC FACTOR ANTIBODIES: Intrinsic Factor: 1 AU/mL (ref 0.0–1.1)

## 2020-08-22 LAB — ANTI-PARIETAL ANTIBODY: Parietal Cell Antibody-IgG: 1.2 Units (ref 0.0–20.0)

## 2020-08-22 LAB — DRVVT MIX: dRVVT Mix: 44.7 s — ABNORMAL HIGH (ref 0.0–40.4)

## 2020-08-23 LAB — CARDIOLIPIN ANTIBODIES, IGG, IGM, IGA
Anticardiolipin IgA: <9 APL U/mL (ref 0–11)
Anticardiolipin IgG: <9 GPL U/mL (ref 0–14)
Anticardiolipin IgM: 9 MPL U/mL (ref 0–12)

## 2020-08-28 ENCOUNTER — Telehealth: Payer: Self-pay

## 2020-08-28 ENCOUNTER — Ambulatory Visit: Payer: Medicare Other | Admitting: Hematology and Oncology

## 2020-08-28 NOTE — Assessment & Plan Note (Deleted)
Currently on long-term anticoagulation for DVT Her daughter also has blood clot and was diagnosed with antiphospholipid antibody syndrome.  Lupus anticoagulant testing: Positive Specific antibodies against cardiolipin and beta-2 microglobulin: Negative  Recommendation: Indefinite anticoagulation Unfortunately Xarelto is causing her rectal bleeding.  (She does not want a gastroenterologist consult.  Because of fear of colon perforation and a family member)  Return to clinic on an as-needed basis.

## 2020-08-28 NOTE — Assessment & Plan Note (Deleted)
Lab review: B12 (1/64/3539): 122, folic acid 8.7, hemoglobin 13.6, MCV 106.4 Inability to tolerate any formulation of B12 including pills, sublingual tablets, injections. We discussed about dietary sources of B12. She also potentially could have malabsorption of B12.   B12: 159 Hemoglobin 14 Antiparietal cell antibody: 1.2 (negative) Antiintrinsic factor antibody: 1 (normal)  Patient does not have pernicious anemia and therefore she can continue dietary sources of B12.  Iron studies: Iron saturation 14%, ferritin 36, TIBC 321

## 2020-08-28 NOTE — Telephone Encounter (Signed)
Pt called and LVM voicing she was upset that she had not heard from Dr Lindi Adie in regards to her telehealth visit today. Dr Lindi Adie attempted to call pt 3X this morning for appt and there was no answer. States in VM she needs to be advised about what to do regarding Xarelto, as the "Xarelto is doing tremendous damage to my body". Dr Lindi Adie tried once again to call pt and LVM for her.

## 2020-09-02 ENCOUNTER — Telehealth: Payer: Self-pay

## 2020-09-02 NOTE — Telephone Encounter (Signed)
RN received voicemail from patient regarding request to have anticoagulation medication reviewed.   RN returned call, voicemail left for patient to call back.

## 2020-09-05 ENCOUNTER — Telehealth: Payer: Self-pay | Admitting: *Deleted

## 2020-09-05 NOTE — Telephone Encounter (Signed)
Leslie Cross left a message stating she has stopped taking the Xarelto. "It is making me sicker, I cannot take it, I have stopped it. From what I read, I shouldn't be taking it to begin with".   States she is at work and will not be able to answer return call.

## 2020-09-08 ENCOUNTER — Telehealth: Payer: Self-pay | Admitting: *Deleted

## 2020-09-08 NOTE — Telephone Encounter (Signed)
LM to let her know that Dr Lindi Adie is aware that she stopped Xarelto and he said " That's fine"

## 2020-09-15 ENCOUNTER — Telehealth: Payer: Self-pay | Admitting: *Deleted

## 2020-09-15 NOTE — Telephone Encounter (Signed)
Attempted to call patient to let her know Dr Lindi Adie states it is OK to not take Xarelto. Pt left message last week stating she had stopped taking due to side effects

## 2020-10-03 DIAGNOSIS — K449 Diaphragmatic hernia without obstruction or gangrene: Secondary | ICD-10-CM | POA: Diagnosis not present

## 2020-10-03 DIAGNOSIS — D123 Benign neoplasm of transverse colon: Secondary | ICD-10-CM | POA: Diagnosis not present

## 2020-10-03 DIAGNOSIS — B3781 Candidal esophagitis: Secondary | ICD-10-CM | POA: Diagnosis not present

## 2020-10-03 DIAGNOSIS — D127 Benign neoplasm of rectosigmoid junction: Secondary | ICD-10-CM | POA: Diagnosis not present

## 2020-10-03 DIAGNOSIS — D124 Benign neoplasm of descending colon: Secondary | ICD-10-CM | POA: Diagnosis not present

## 2020-10-03 DIAGNOSIS — K922 Gastrointestinal hemorrhage, unspecified: Secondary | ICD-10-CM | POA: Diagnosis not present

## 2020-10-03 DIAGNOSIS — K625 Hemorrhage of anus and rectum: Secondary | ICD-10-CM | POA: Diagnosis not present

## 2020-10-03 DIAGNOSIS — D125 Benign neoplasm of sigmoid colon: Secondary | ICD-10-CM | POA: Diagnosis not present

## 2020-10-03 DIAGNOSIS — D122 Benign neoplasm of ascending colon: Secondary | ICD-10-CM | POA: Diagnosis not present

## 2020-10-03 DIAGNOSIS — D509 Iron deficiency anemia, unspecified: Secondary | ICD-10-CM | POA: Diagnosis not present

## 2020-10-03 DIAGNOSIS — K2980 Duodenitis without bleeding: Secondary | ICD-10-CM | POA: Diagnosis not present

## 2020-10-03 DIAGNOSIS — K2930 Chronic superficial gastritis without bleeding: Secondary | ICD-10-CM | POA: Diagnosis not present

## 2020-10-08 DIAGNOSIS — B3781 Candidal esophagitis: Secondary | ICD-10-CM | POA: Diagnosis not present

## 2020-10-08 DIAGNOSIS — D125 Benign neoplasm of sigmoid colon: Secondary | ICD-10-CM | POA: Diagnosis not present

## 2020-10-08 DIAGNOSIS — D123 Benign neoplasm of transverse colon: Secondary | ICD-10-CM | POA: Diagnosis not present

## 2020-10-08 DIAGNOSIS — D122 Benign neoplasm of ascending colon: Secondary | ICD-10-CM | POA: Diagnosis not present

## 2020-10-08 DIAGNOSIS — D124 Benign neoplasm of descending colon: Secondary | ICD-10-CM | POA: Diagnosis not present

## 2020-10-08 DIAGNOSIS — K2930 Chronic superficial gastritis without bleeding: Secondary | ICD-10-CM | POA: Diagnosis not present

## 2020-10-08 DIAGNOSIS — D127 Benign neoplasm of rectosigmoid junction: Secondary | ICD-10-CM | POA: Diagnosis not present

## 2020-10-08 DIAGNOSIS — K2980 Duodenitis without bleeding: Secondary | ICD-10-CM | POA: Diagnosis not present

## 2021-02-25 IMAGING — CT CT ANGIOGRAPHY CHEST
1 of 6 series · 4 of 16 positions shown · IV contrast (omnipaque)
Comparison: None.

CLINICAL DATA: History of blood clots with new onset leg pain. PE
suspected, high pretest probability.

EXAM:
CT ANGIOGRAPHY CHEST WITH CONTRAST
TECHNIQUE: Multidetector CT imaging of the chest was performed using the
standard protocol during bolus administration of intravenous
contrast. Multiplanar CT image reconstructions and MIPs were
obtained to evaluate the vascular anatomy.
CONTRAST:  69mL OMNIPAQUE IOHEXOL 350 MG/ML SOLN

[Series 7: pe thins · axial · 0.65mm/px · z∈[+1182,+1378]mm · 4 of 466 slices shown]
[im 94/466  lung]
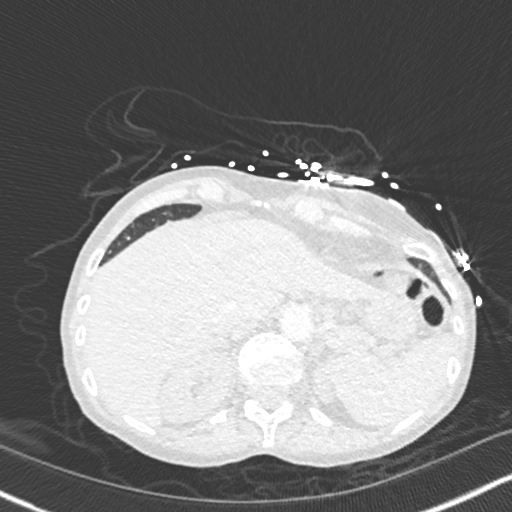
[im 187/466  soft-tissue]
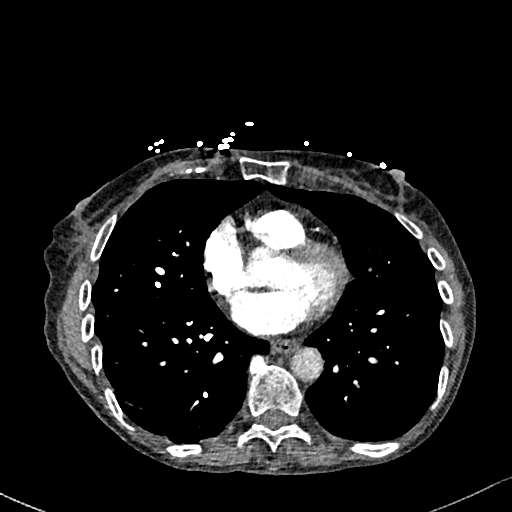
[im 280/466  lung]
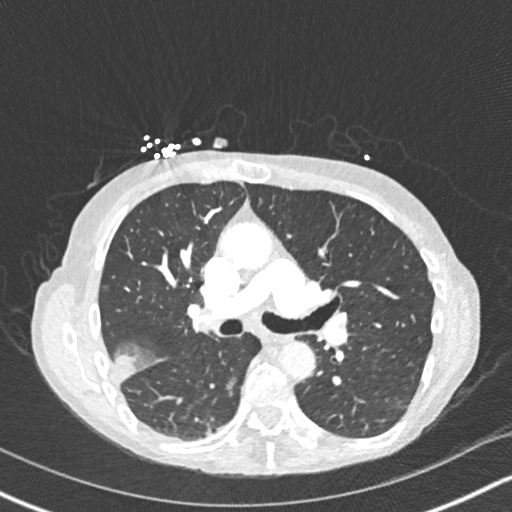
[im 373/466  soft-tissue]
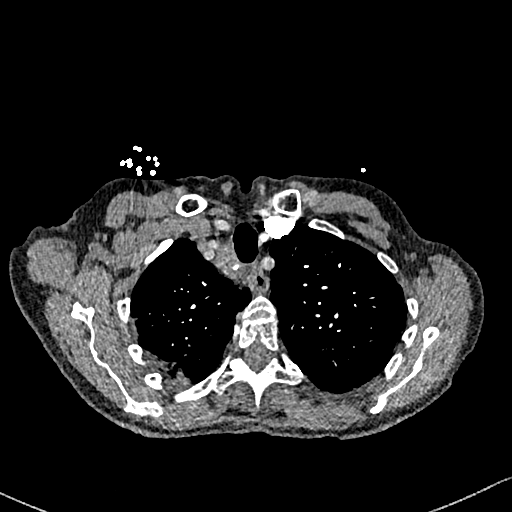

[4 of 16 positions shown; findings below may reference images not displayed]

FINDINGS: Cardiovascular: Acute pulmonary emboli are seen extending from the
RIGHT intralobar pulmonary artery into multiple segmental and
subsegmental pulmonary artery branches to the RIGHT lower lobe,
RIGHT middle lobe and RIGHT upper lobe, some nearly occlusive.

Additional partially occlusive pulmonary emboli w are seen within
the central segmental pulmonary artery branches to the LEFT lower
lobe, lingula and LEFT upper lobe, including fairly extensive
occlusive thrombus within the pulmonary branches to thew anterior
segment of the LEFT upper lobe.

Heart size is normal. No pericardial effusion. Thoracic aorta is
normal in caliber and configuration.

Mediastinum/Nodes: No mass or enlarged lymph nodes seen within the
mediastinum or perihilar regions. Asymmetric prominence of the RIGHT
thyroid lobe, presumably goiter. Esophagus is unremarkable. Trachea
and central bronchi are unremarkable.

Lungs/Pleura: Peripheral wedge-shaped consolidation within the
inferior segment of the RIGHT upper lobe, lateral aspect, consistent
with pulmonary infarction. Additional rounded consolidations within
the periphery of the RIGHT lower lobe and LEFT lower lobe, pleural
based, suspicious for additional pulmonary infarcts. No pleural
effusion or pneumothorax.

Upper Abdomen: Limited images of the upper abdomen are unremarkable.

Musculoskeletal: No acute or suspicious osseous finding.

Review of the MIP images confirms the above findings.
IMPRESSION: 1. Bilateral pulmonary emboli, fairly extensive bilaterally
including occlusive thrombus within the central pulmonary artery
branch to the anterior segment of the LEFT upper lobe, with
associated pulmonary infarcts within the RIGHT upper lobe and both
lower lobes. Recommend follow-up chest CT at some point to ensure
resolution of the presumed infarcts and exclude the less likely
possibility of underlying neoplastic process.
2. No CT evidence of RIGHT heart strain. No pleural effusion or
pulmonary edema.
3. Asymmetric prominence of the RIGHT thyroid lobe, presumably
goiter. Consider nonemergent follow-up with thyroid ultrasound
and/or nuclear medicine thyroid scan.

Critical Value/emergent results and recommendations were called by
Dr. RANTONA BHEBHE , who verbally acknowledged these results.

## 2021-03-19 ENCOUNTER — Telehealth: Payer: Self-pay | Admitting: Gastroenterology

## 2021-03-19 NOTE — Telephone Encounter (Signed)
Request received to transfer GI care from outside practice to [facility].  We appreciate the interest in our practice, however at this time due to high demand from patients without established GI providers we cannot accommodate this transfer.  Ability to accommodate future transfer requests may change over time and the patient can contact us again in 6-12 months if still interested in being seen at [facility].   Since the brief information provided suggests the colonoscopy was incomplete she should consider requesting her [facility] physician to refer her to a tertiary center such as [facility], [place] or [facility] for her colonoscopy.

## 2021-03-19 NOTE — Telephone Encounter (Signed)
Hey Dr. Fuller Plan,   We received a referral for colonoscopy. Patient had previous procedure 09/2020 but states she need another that [facility] cannot preform as need to extend higher. She prefer to have you as provider. I have records. Could you please review and advise on scheduling?  Thank you

## 2021-03-24 NOTE — Telephone Encounter (Signed)
Left vm with patient of recommendations as verified name on vm. Advised could call back if have any questions

## 2021-03-30 NOTE — Telephone Encounter (Signed)
Spoke with patient she is ware of recommendations.

## 2021-05-12 DIAGNOSIS — D509 Iron deficiency anemia, unspecified: Secondary | ICD-10-CM | POA: Diagnosis not present

## 2021-05-12 DIAGNOSIS — I82409 Acute embolism and thrombosis of unspecified deep veins of unspecified lower extremity: Secondary | ICD-10-CM | POA: Diagnosis not present

## 2021-05-12 DIAGNOSIS — D7589 Other specified diseases of blood and blood-forming organs: Secondary | ICD-10-CM | POA: Diagnosis not present

## 2021-05-12 DIAGNOSIS — R1013 Epigastric pain: Secondary | ICD-10-CM | POA: Diagnosis not present

## 2021-05-12 DIAGNOSIS — E538 Deficiency of other specified B group vitamins: Secondary | ICD-10-CM | POA: Diagnosis not present

## 2021-08-13 DIAGNOSIS — R197 Diarrhea, unspecified: Secondary | ICD-10-CM | POA: Diagnosis not present

## 2021-08-13 DIAGNOSIS — R140 Abdominal distension (gaseous): Secondary | ICD-10-CM | POA: Diagnosis not present

## 2021-08-13 DIAGNOSIS — R636 Underweight: Secondary | ICD-10-CM | POA: Diagnosis not present

## 2021-08-13 DIAGNOSIS — Z86010 Personal history of colonic polyps: Secondary | ICD-10-CM | POA: Diagnosis not present

## 2021-08-25 DIAGNOSIS — I499 Cardiac arrhythmia, unspecified: Secondary | ICD-10-CM | POA: Diagnosis not present

## 2021-08-25 DIAGNOSIS — R079 Chest pain, unspecified: Secondary | ICD-10-CM | POA: Diagnosis not present

## 2021-08-25 DIAGNOSIS — R6889 Other general symptoms and signs: Secondary | ICD-10-CM | POA: Diagnosis not present

## 2021-08-25 DIAGNOSIS — Z743 Need for continuous supervision: Secondary | ICD-10-CM | POA: Diagnosis not present

## 2021-08-25 DIAGNOSIS — R0789 Other chest pain: Secondary | ICD-10-CM | POA: Diagnosis not present

## 2021-08-26 ENCOUNTER — Other Ambulatory Visit: Payer: Self-pay

## 2021-08-26 ENCOUNTER — Inpatient Hospital Stay (HOSPITAL_COMMUNITY)
Admission: EM | Disposition: A | Discharge status: Home / Self Care | Payer: Self-pay | Source: Home / Self Care | Attending: Cardiology

## 2021-08-26 ENCOUNTER — Telehealth (HOSPITAL_COMMUNITY): Payer: Self-pay | Admitting: Pharmacy Technician

## 2021-08-26 ENCOUNTER — Other Ambulatory Visit (HOSPITAL_COMMUNITY): Payer: Self-pay

## 2021-08-26 ENCOUNTER — Inpatient Hospital Stay (HOSPITAL_COMMUNITY): Payer: Medicare Other

## 2021-08-26 ENCOUNTER — Emergency Department (HOSPITAL_COMMUNITY): Payer: Medicare Other

## 2021-08-26 ENCOUNTER — Inpatient Hospital Stay (HOSPITAL_COMMUNITY)
Admission: EM | Admit: 2021-08-26 | Discharge: 2021-08-27 | DRG: 247 | Disposition: A | Discharge status: Home / Self Care | Payer: Medicare Other | Attending: Cardiology | Admitting: Cardiology

## 2021-08-26 ENCOUNTER — Encounter (HOSPITAL_COMMUNITY): Payer: Self-pay | Admitting: Cardiology

## 2021-08-26 DIAGNOSIS — Z86711 Personal history of pulmonary embolism: Secondary | ICD-10-CM | POA: Diagnosis not present

## 2021-08-26 DIAGNOSIS — R079 Chest pain, unspecified: Secondary | ICD-10-CM | POA: Diagnosis not present

## 2021-08-26 DIAGNOSIS — I2109 ST elevation (STEMI) myocardial infarction involving other coronary artery of anterior wall: Principal | ICD-10-CM | POA: Diagnosis present

## 2021-08-26 DIAGNOSIS — I2510 Atherosclerotic heart disease of native coronary artery without angina pectoris: Secondary | ICD-10-CM

## 2021-08-26 DIAGNOSIS — K921 Melena: Secondary | ICD-10-CM | POA: Diagnosis not present

## 2021-08-26 DIAGNOSIS — E440 Moderate protein-calorie malnutrition: Secondary | ICD-10-CM | POA: Diagnosis not present

## 2021-08-26 DIAGNOSIS — E43 Unspecified severe protein-calorie malnutrition: Secondary | ICD-10-CM | POA: Diagnosis not present

## 2021-08-26 DIAGNOSIS — I213 ST elevation (STEMI) myocardial infarction of unspecified site: Secondary | ICD-10-CM | POA: Diagnosis not present

## 2021-08-26 DIAGNOSIS — E7800 Pure hypercholesterolemia, unspecified: Secondary | ICD-10-CM | POA: Diagnosis not present

## 2021-08-26 DIAGNOSIS — I472 Ventricular tachycardia, unspecified: Secondary | ICD-10-CM | POA: Diagnosis not present

## 2021-08-26 DIAGNOSIS — I2102 ST elevation (STEMI) myocardial infarction involving left anterior descending coronary artery: Secondary | ICD-10-CM | POA: Diagnosis not present

## 2021-08-26 DIAGNOSIS — Z91014 Allergy to mammalian meats: Secondary | ICD-10-CM

## 2021-08-26 DIAGNOSIS — E538 Deficiency of other specified B group vitamins: Secondary | ICD-10-CM | POA: Diagnosis not present

## 2021-08-26 DIAGNOSIS — F17200 Nicotine dependence, unspecified, uncomplicated: Secondary | ICD-10-CM | POA: Diagnosis not present

## 2021-08-26 DIAGNOSIS — I4729 Other ventricular tachycardia: Secondary | ICD-10-CM | POA: Diagnosis not present

## 2021-08-26 DIAGNOSIS — Z86718 Personal history of other venous thrombosis and embolism: Secondary | ICD-10-CM | POA: Diagnosis not present

## 2021-08-26 DIAGNOSIS — Z881 Allergy status to other antibiotic agents status: Secondary | ICD-10-CM | POA: Diagnosis not present

## 2021-08-26 DIAGNOSIS — I25110 Atherosclerotic heart disease of native coronary artery with unstable angina pectoris: Secondary | ICD-10-CM | POA: Diagnosis present

## 2021-08-26 DIAGNOSIS — F17210 Nicotine dependence, cigarettes, uncomplicated: Secondary | ICD-10-CM | POA: Diagnosis present

## 2021-08-26 DIAGNOSIS — I824Y1 Acute embolism and thrombosis of unspecified deep veins of right proximal lower extremity: Secondary | ICD-10-CM

## 2021-08-26 DIAGNOSIS — Z880 Allergy status to penicillin: Secondary | ICD-10-CM | POA: Diagnosis not present

## 2021-08-26 DIAGNOSIS — Z20822 Contact with and (suspected) exposure to covid-19: Secondary | ICD-10-CM | POA: Diagnosis present

## 2021-08-26 DIAGNOSIS — I255 Ischemic cardiomyopathy: Secondary | ICD-10-CM | POA: Diagnosis present

## 2021-08-26 DIAGNOSIS — I083 Combined rheumatic disorders of mitral, aortic and tricuspid valves: Secondary | ICD-10-CM | POA: Diagnosis not present

## 2021-08-26 DIAGNOSIS — Z681 Body mass index (BMI) 19 or less, adult: Secondary | ICD-10-CM | POA: Diagnosis not present

## 2021-08-26 DIAGNOSIS — Z955 Presence of coronary angioplasty implant and graft: Secondary | ICD-10-CM

## 2021-08-26 HISTORY — PX: LEFT HEART CATH AND CORONARY ANGIOGRAPHY: CATH118249

## 2021-08-26 HISTORY — DX: ST elevation (STEMI) myocardial infarction involving other coronary artery of anterior wall: I21.09

## 2021-08-26 HISTORY — PX: CORONARY/GRAFT ACUTE MI REVASCULARIZATION: CATH118305

## 2021-08-26 LAB — MRSA NEXT GEN BY PCR, NASAL: MRSA by PCR Next Gen: NOT DETECTED

## 2021-08-26 LAB — I-STAT CHEM 8, ED
BUN: 13 mg/dL (ref 8–23)
Calcium, Ion: 1.04 mmol/L — ABNORMAL LOW (ref 1.15–1.40)
Chloride: 103 mmol/L (ref 98–111)
Creatinine, Ser: 0.6 mg/dL (ref 0.44–1.00)
Glucose, Bld: 134 mg/dL — ABNORMAL HIGH (ref 70–99)
HCT: 37 % (ref 36.0–46.0)
Hemoglobin: 12.6 g/dL (ref 12.0–15.0)
Potassium: 3.8 mmol/L (ref 3.5–5.1)
Sodium: 140 mmol/L (ref 135–145)
TCO2: 24 mmol/L (ref 22–32)

## 2021-08-26 LAB — COMPREHENSIVE METABOLIC PANEL
ALT: 14 U/L (ref 0–44)
AST: 20 U/L (ref 15–41)
Albumin: 3.3 g/dL — ABNORMAL LOW (ref 3.5–5.0)
Alkaline Phosphatase: 129 U/L — ABNORMAL HIGH (ref 38–126)
Anion gap: 9 (ref 5–15)
BUN: 10 mg/dL (ref 8–23)
CO2: 23 mmol/L (ref 22–32)
Calcium: 8.4 mg/dL — ABNORMAL LOW (ref 8.9–10.3)
Chloride: 107 mmol/L (ref 98–111)
Creatinine, Ser: 0.75 mg/dL (ref 0.44–1.00)
GFR, Estimated: >60 mL/min (ref >60)
Glucose, Bld: 142 mg/dL — ABNORMAL HIGH (ref 70–99)
Potassium: 3.9 mmol/L (ref 3.5–5.1)
Sodium: 139 mmol/L (ref 135–145)
Total Bilirubin: 0.4 mg/dL (ref 0.3–1.2)
Total Protein: 5.1 g/dL — ABNORMAL LOW (ref 6.5–8.1)

## 2021-08-26 LAB — RESP PANEL BY RT-PCR (FLU A&B, COVID) ARPGX2
Influenza A by PCR: NEGATIVE
Influenza B by PCR: NEGATIVE
SARS Coronavirus 2 by RT PCR: NEGATIVE

## 2021-08-26 LAB — CBC WITH DIFFERENTIAL/PLATELET
Abs Immature Granulocytes: 0.03 10*3/uL (ref 0.00–0.07)
Basophils Absolute: 0.1 10*3/uL (ref 0.0–0.1)
Basophils Relative: 1 %
Eosinophils Absolute: 0.1 10*3/uL (ref 0.0–0.5)
Eosinophils Relative: 1 %
HCT: 38.2 % (ref 36.0–46.0)
Hemoglobin: 12.7 g/dL (ref 12.0–15.0)
Immature Granulocytes: 0 %
Lymphocytes Relative: 17 %
Lymphs Abs: 1.6 10*3/uL (ref 0.7–4.0)
MCH: 33.2 pg (ref 26.0–34.0)
MCHC: 33.2 g/dL (ref 30.0–36.0)
MCV: 99.7 fL (ref 80.0–100.0)
Monocytes Absolute: 0.6 10*3/uL (ref 0.1–1.0)
Monocytes Relative: 6 %
Neutro Abs: 7.1 10*3/uL (ref 1.7–7.7)
Neutrophils Relative %: 75 %
Platelets: 217 10*3/uL (ref 150–400)
RBC: 3.83 MIL/uL — ABNORMAL LOW (ref 3.87–5.11)
RDW: 11.6 % (ref 11.5–15.5)
WBC: 9.5 10*3/uL (ref 4.0–10.5)
nRBC: 0 % (ref 0.0–0.2)

## 2021-08-26 LAB — BASIC METABOLIC PANEL
Anion gap: 7 (ref 5–15)
BUN: 7 mg/dL — ABNORMAL LOW (ref 8–23)
CO2: 23 mmol/L (ref 22–32)
Calcium: 8.6 mg/dL — ABNORMAL LOW (ref 8.9–10.3)
Chloride: 110 mmol/L (ref 98–111)
Creatinine, Ser: 0.54 mg/dL (ref 0.44–1.00)
GFR, Estimated: >60 mL/min (ref >60)
Glucose, Bld: 109 mg/dL — ABNORMAL HIGH (ref 70–99)
Potassium: 3.5 mmol/L (ref 3.5–5.1)
Sodium: 140 mmol/L (ref 135–145)

## 2021-08-26 LAB — CBC
HCT: 37.7 % (ref 36.0–46.0)
Hemoglobin: 13.2 g/dL (ref 12.0–15.0)
MCH: 33.8 pg (ref 26.0–34.0)
MCHC: 35 g/dL (ref 30.0–36.0)
MCV: 96.4 fL (ref 80.0–100.0)
Platelets: 227 10*3/uL (ref 150–400)
RBC: 3.91 MIL/uL (ref 3.87–5.11)
RDW: 11.7 % (ref 11.5–15.5)
WBC: 10.5 10*3/uL (ref 4.0–10.5)
nRBC: 0 % (ref 0.0–0.2)

## 2021-08-26 LAB — LIPID PANEL
Cholesterol: 147 mg/dL (ref 0–200)
HDL: 25 mg/dL — ABNORMAL LOW (ref >40)
LDL Cholesterol: 103 mg/dL — ABNORMAL HIGH (ref 0–99)
Total CHOL/HDL Ratio: 5.9 RATIO
Triglycerides: 94 mg/dL (ref <150)
VLDL: 19 mg/dL (ref 0–40)

## 2021-08-26 LAB — HEMOGLOBIN A1C
Hgb A1c MFr Bld: 5.4 % (ref 4.8–5.6)
Mean Plasma Glucose: 108.28 mg/dL

## 2021-08-26 LAB — POCT ACTIVATED CLOTTING TIME: Activated Clotting Time: 335 seconds

## 2021-08-26 LAB — APTT: aPTT: 23 seconds — ABNORMAL LOW (ref 24–36)

## 2021-08-26 LAB — TROPONIN I (HIGH SENSITIVITY): Troponin I (High Sensitivity): 235 ng/L (ref <18)

## 2021-08-26 LAB — PROTIME-INR
INR: 1.2 (ref 0.8–1.2)
Prothrombin Time: 14.7 seconds (ref 11.4–15.2)

## 2021-08-26 SURGERY — CORONARY/GRAFT ACUTE MI REVASCULARIZATION
Anesthesia: LOCAL

## 2021-08-26 MED ORDER — ENSURE ENLIVE PO LIQD: 237.0000 mL | Freq: Two times a day (BID) | ORAL | Status: DC

## 2021-08-26 MED ORDER — IOHEXOL 350 MG/ML SOLN
INTRAVENOUS | Status: DC | PRN
  Administered 2021-08-26: 115 mL

## 2021-08-26 MED ORDER — HEPARIN SODIUM (PORCINE) 1000 UNIT/ML IJ SOLN
INTRAMUSCULAR | Status: AC
  Filled 2021-08-26: qty 10

## 2021-08-26 MED ORDER — HEPARIN SODIUM (PORCINE) 5000 UNIT/ML IJ SOLN
60.0000 [IU]/kg | Freq: Once | INTRAMUSCULAR | Status: AC
  Administered 2021-08-26: 2550 [IU] via INTRAVENOUS
  Filled 2021-08-26: qty 1

## 2021-08-26 MED ORDER — POTASSIUM CHLORIDE CRYS ER 20 MEQ PO TBCR
20.0000 meq | EXTENDED_RELEASE_TABLET | Freq: Once | ORAL | Status: AC
Start: 2021-08-26 — End: 2021-08-26
  Administered 2021-08-26: 20 meq via ORAL
  Filled 2021-08-26: qty 1

## 2021-08-26 MED ORDER — ASPIRIN 81 MG PO CHEW
81.0000 mg | CHEWABLE_TABLET | Freq: Every day | ORAL | Status: DC
Start: 2021-08-27 — End: 2021-08-27
  Administered 2021-08-27: 81 mg via ORAL
  Filled 2021-08-26: qty 1

## 2021-08-26 MED ORDER — LIDOCAINE HCL (PF) 1 % IJ SOLN
INTRAMUSCULAR | Status: AC
  Filled 2021-08-26: qty 30

## 2021-08-26 MED ORDER — HEPARIN SODIUM (PORCINE) 1000 UNIT/ML IJ SOLN
INTRAMUSCULAR | Status: DC | PRN
  Administered 2021-08-26: 4000 [IU] via INTRAVENOUS

## 2021-08-26 MED ORDER — SODIUM CHLORIDE 0.9 % IV SOLN: INTRAVENOUS | Status: DC

## 2021-08-26 MED ORDER — TICAGRELOR 90 MG PO TABS
90.0000 mg | ORAL_TABLET | Freq: Two times a day (BID) | ORAL | Status: DC
  Administered 2021-08-27: 90 mg via ORAL
  Filled 2021-08-26: qty 1

## 2021-08-26 MED ORDER — NITROGLYCERIN 1 MG/10 ML FOR IR/CATH LAB
INTRA_ARTERIAL | Status: DC | PRN
  Administered 2021-08-26 (×2): 100 ug via INTRACORONARY

## 2021-08-26 MED ORDER — METOPROLOL SUCCINATE ER 25 MG PO TB24
25.0000 mg | ORAL_TABLET | Freq: Every day | ORAL | Status: DC
  Administered 2021-08-27: 25 mg via ORAL
  Filled 2021-08-26 (×2): qty 1

## 2021-08-26 MED ORDER — LOSARTAN POTASSIUM 25 MG PO TABS
25.0000 mg | ORAL_TABLET | Freq: Every evening | ORAL | Status: DC
  Filled 2021-08-26: qty 1

## 2021-08-26 MED ORDER — TICAGRELOR 90 MG PO TABS
ORAL_TABLET | ORAL | Status: AC
  Filled 2021-08-26: qty 2

## 2021-08-26 MED ORDER — SODIUM CHLORIDE 0.9% FLUSH: 3.0000 mL | INTRAVENOUS | Status: DC | PRN

## 2021-08-26 MED ORDER — VERAPAMIL HCL 2.5 MG/ML IV SOLN
INTRAVENOUS | Status: DC | PRN
  Administered 2021-08-26: 10 mL via INTRA_ARTERIAL

## 2021-08-26 MED ORDER — FERROUS SULFATE 325 (65 FE) MG PO TABS
324.0000 mg | ORAL_TABLET | Freq: Every day | ORAL | Status: DC
  Administered 2021-08-26 – 2021-08-27 (×2): 324 mg via ORAL
  Filled 2021-08-26 (×3): qty 1

## 2021-08-26 MED ORDER — SODIUM CHLORIDE 0.9% FLUSH
3.0000 mL | Freq: Two times a day (BID) | INTRAVENOUS | Status: DC
  Administered 2021-08-26 (×2): 3 mL via INTRAVENOUS

## 2021-08-26 MED ORDER — SODIUM CHLORIDE 0.9 % WEIGHT BASED INFUSION
1.0000 mL/kg/h | INTRAVENOUS | Status: AC
  Administered 2021-08-26: 1 mL/kg/h via INTRAVENOUS

## 2021-08-26 MED ORDER — NITROGLYCERIN 1 MG/10 ML FOR IR/CATH LAB
INTRA_ARTERIAL | Status: AC
  Filled 2021-08-26: qty 10

## 2021-08-26 MED ORDER — HEPARIN (PORCINE) IN NACL 1000-0.9 UT/500ML-% IV SOLN
INTRAVENOUS | Status: DC | PRN
  Administered 2021-08-26 (×2): 500 mL

## 2021-08-26 MED ORDER — ACETAMINOPHEN 325 MG PO TABS: 650.0000 mg | ORAL_TABLET | ORAL | Status: DC | PRN

## 2021-08-26 MED ORDER — VERAPAMIL HCL 2.5 MG/ML IV SOLN
INTRAVENOUS | Status: AC
  Filled 2021-08-26: qty 2

## 2021-08-26 MED ORDER — MIDAZOLAM HCL 2 MG/2ML IJ SOLN
INTRAMUSCULAR | Status: AC
  Filled 2021-08-26: qty 2

## 2021-08-26 MED ORDER — CHLORHEXIDINE GLUCONATE CLOTH 2 % EX PADS
6.0000 | MEDICATED_PAD | Freq: Every day | CUTANEOUS | Status: DC
Start: 2021-08-26 — End: 2021-08-27
  Administered 2021-08-26 – 2021-08-27 (×2): 6 via TOPICAL

## 2021-08-26 MED ORDER — HEPARIN (PORCINE) IN NACL 1000-0.9 UT/500ML-% IV SOLN
INTRAVENOUS | Status: AC
  Filled 2021-08-26: qty 1000

## 2021-08-26 MED ORDER — MIDAZOLAM HCL 2 MG/2ML IJ SOLN
INTRAMUSCULAR | Status: DC | PRN
  Administered 2021-08-26: 1 mg via INTRAVENOUS

## 2021-08-26 MED ORDER — TICAGRELOR 90 MG PO TABS
ORAL_TABLET | ORAL | Status: DC | PRN
  Administered 2021-08-26: 180 mg via ORAL

## 2021-08-26 MED ORDER — ATORVASTATIN CALCIUM 40 MG PO TABS
40.0000 mg | ORAL_TABLET | Freq: Every day | ORAL | Status: DC
  Administered 2021-08-26 – 2021-08-27 (×2): 40 mg via ORAL
  Filled 2021-08-26 (×3): qty 1

## 2021-08-26 MED ORDER — ONDANSETRON HCL 4 MG/2ML IJ SOLN: 4.0000 mg | Freq: Four times a day (QID) | INTRAMUSCULAR | Status: DC | PRN

## 2021-08-26 MED ORDER — SODIUM CHLORIDE 0.9 % IV SOLN: 250.0000 mL | INTRAVENOUS | Status: DC | PRN

## 2021-08-26 SURGICAL SUPPLY — 18 items
BALLN SAPPHIRE 2.0X12 (BALLOONS) ×2
BALLOON SAPPHIRE 2.0X12 (BALLOONS) IMPLANT
BAND ZEPHYR COMPRESS 30 LONG (HEMOSTASIS) ×1 IMPLANT
CATH EXTRAC PRONTO 5.5F 138CM (CATHETERS) ×1 IMPLANT
CATH LAUNCHER 6FR EBU 3 (CATHETERS) ×1 IMPLANT
CATH OPTITORQUE TIG 4.0 5F (CATHETERS) ×1 IMPLANT
GLIDESHEATH SLEND A-KIT 6F 22G (SHEATH) ×1 IMPLANT
GUIDEWIRE ANGLED .035X150CM (WIRE) ×1 IMPLANT
GUIDEWIRE INQWIRE 1.5J.035X260 (WIRE) IMPLANT
INQWIRE 1.5J .035X260CM (WIRE) ×2
KIT ENCORE 26 ADVANTAGE (KITS) ×1 IMPLANT
KIT HEART LEFT (KITS) ×2 IMPLANT
PACK CARDIAC CATHETERIZATION (CUSTOM PROCEDURE TRAY) ×2 IMPLANT
STENT SYNERGY XD 2.50X28 (Permanent Stent) IMPLANT
SYNERGY XD 2.50X28 (Permanent Stent) ×2 IMPLANT
TRANSDUCER W/STOPCOCK (MISCELLANEOUS) ×2 IMPLANT
TUBING CIL FLEX 10 FLL-RA (TUBING) ×2 IMPLANT
WIRE COUGAR XT STRL 190CM (WIRE) ×1 IMPLANT

## 2021-08-26 NOTE — ED Provider Notes (Signed)
Emergency Department Provider Note   I have reviewed the triage vital signs and the nursing notes.   HISTORY  Chief Complaint Code STEMI   HPI Leslie Cross is a 71 y.o. female with past history of DVT, not on anticoagulation, presents emergency department with 2 hours of central chest heaviness/pressure.  Patient reports some associated nausea/vomiting along with diaphoresis.  She has no prior history of heart attack.  EMS arrived on scene and noted that EKG showed ST elevation and activated STEMI.  They gave 1 nitroglycerin but noted that patient's blood pressures with them were as low as 308 systolic and so did not give additional nitroglycerin or morphine.  She did receive 324 mg aspirin.  Continues to have 4-10 chest pain on arrival.    Past Medical History:  Diagnosis Date   Recurrent acute deep vein thrombosis (DVT) of lower extremity ([place]) 12/12/2018    Review of Systems  Constitutional: No fever/chills Eyes: No visual changes. ENT: No sore throat. Cardiovascular: Positive chest pain and diaphoresis.  Respiratory: Denies shortness of breath. Gastrointestinal: No abdominal pain.  Positive nausea and vomiting.  No diarrhea.  No constipation. Genitourinary: Negative for dysuria. Musculoskeletal: Negative for back pain. Skin: Negative for rash. Neurological: Negative for headaches, focal weakness or numbness.  ____________________________________________   PHYSICAL EXAM:  VITAL SIGNS: ED Triage Vitals  Enc Vitals Group     BP 08/26/21 0030 109/62     Pulse Rate 08/26/21 0030 64     Resp 08/26/21 0030 17     Temp 08/26/21 0030 97.6 F (36.4 C)     Temp Source 08/26/21 0030 Oral     SpO2 08/26/21 0030 97 %     Weight 08/26/21 0029 93 lb (42.2 kg)     Height 08/26/21 0029 '5\' 2"'$  (1.575 m)   Constitutional: Alert and oriented. Well appearing and in no acute distress. Eyes: Conjunctivae are normal.  Head: Atraumatic. Nose: No  congestion/rhinnorhea. Mouth/Throat: Mucous membranes are moist.   Neck: No stridor.  Cardiovascular: Normal rate, regular rhythm. Good peripheral circulation. Grossly normal heart sounds.   Respiratory: Normal respiratory effort.  No retractions. Lungs CTAB. Gastrointestinal: Soft and nontender. No distention.  Musculoskeletal: No lower extremity tenderness nor edema. No gross deformities of extremities. Neurologic:  Normal speech and language. No gross focal neurologic deficits are appreciated.  Skin:  Skin is warm, dry and intact. No rash noted.  ____________________________________________   LABS (all labs ordered are listed, but only abnormal results are displayed)  Labs Reviewed  CBC WITH DIFFERENTIAL/PLATELET - Abnormal; Notable for the following components:      Result Value   RBC 3.83 (*)    All other components within normal limits  APTT - Abnormal; Notable for the following components:   aPTT 23 (*)    All other components within normal limits  I-STAT CHEM 8, ED - Abnormal; Notable for the following components:   Glucose, Bld 134 (*)    Calcium, Ion 1.04 (*)    All other components within normal limits  RESP PANEL BY RT-PCR (FLU A&B, COVID) ARPGX2  HEMOGLOBIN A1C  PROTIME-INR  COMPREHENSIVE METABOLIC PANEL  LIPID PANEL  TROPONIN I (HIGH SENSITIVITY)   ____________________________________________  EKG   EKG Interpretation  Date/Time:  Wednesday August 26 2021 00:30:07 EDT Ventricular Rate:  58 PR Interval:  125 QRS Duration: 92 QT Interval:  466 QTC Calculation: 458 R Axis:   27 Text Interpretation: Sinus rhythm Atrial premature complex Anterior infarct, possibly acute  Artifact in lead(s) I II III aVR aVL aVF V1 V2 V3 >>> Acute MI <<< Confirmed by Nanda Quinton [phone]) on 08/26/2021 12:39:01 AM        ____________________________________________  RADIOLOGY  DG Chest Port 1 View  Result Date: 08/26/2021 CLINICAL DATA:  Chest pain EXAM: PORTABLE CHEST 1  VIEW COMPARISON:  08/25/2018 FINDINGS: Mild hyperinflation. Biapical scarring. No confluent airspace opacities or effusions. Heart is normal size. IMPRESSION: Hyperinflation, chronic changes.  No active disease. Electronically Signed   By: Rolm Baptise M.D.   On: 08/26/2021 00:45    ____________________________________________   PROCEDURES  Procedure(s) performed:   .Critical Care  Performed by: Margette Fast, MD Authorized by: Margette Fast, MD   Critical care provider statement:    Critical care time (minutes):  30   Critical care time was exclusive of:  Separately billable procedures and treating other patients and teaching time   Critical care was necessary to treat or prevent imminent or life-threatening deterioration of the following conditions:  Cardiac failure   Critical care was time spent personally by me on the following activities:  Development of treatment plan with patient or surrogate, discussions with consultants, evaluation of patient's response to treatment, examination of patient, ordering and review of laboratory studies, ordering and review of radiographic studies, ordering and performing treatments and interventions, pulse oximetry, re-evaluation of patient's condition, review of old charts and obtaining history from patient or surrogate   I assumed direction of critical care for this patient from another provider in my specialty: no     Care discussed with: admitting provider      ____________________________________________   INITIAL IMPRESSION / Lyn[place] / ED COURSE  Pertinent labs & imaging results that were available during my care of the patient were reviewed by me and considered in my medical decision making (see chart for details).   This patient is Presenting for Evaluation of CP, which does require a range of treatment options, and is a complaint that involves a high risk of morbidity and mortality.  The Differential Diagnoses  includes but  is not exclusive to acute coronary syndrome, aortic dissection, pulmonary embolism, cardiac tamponade, community-acquired pneumonia, pericarditis, musculoskeletal chest wall pain, etc.   Critical Interventions-    Medications  0.9 %  sodium chloride infusion (has no administration in time range)  Radial Cocktail/Verapamil only (10 mLs Intra-arterial Given 08/26/21 0102)  heparin injection 2,550 Units (2,550 Units Intravenous Given 08/26/21 0039)    Reassessment after intervention: Pain decreased slightly.    I did obtain Additional Historical Information from EMS.  I decided to review pertinent External Data, and in summary no prior history of ACS in our system.   Clinical Laboratory Tests Ordered, included no acute kidney injury.  No anemia.  Platelet count 217 is normal.  Troponin pending.  Radiologic Tests Ordered, included CXR. I independently interpreted the images and agree with radiology interpretation.   Cardiac Monitor Tracing which shows NSR.   Social Determinants of Health Risk patient is a smoker.   Consult complete with Cardiology, Dr. Einar Gip. Plan to take patient urgently for left heart cath.  Medical Decision Making: Summary:  Patient presents the emergency department with chest discomfort and EKG concerning for STEMI.  STEMI activation prior to arrival.  Discussed the case with Dr. Einar Gip with ED arrival EKG.  Plan for patient to go emergently to the Cath Lab.  Heparin given.  Vital signs remain stable in the ED.  Not requiring external  pacing or airway protection. Patient re-evaluated and doing well. Updated on plan for heart cath and admit.   Disposition: admit  ____________________________________________  FINAL CLINICAL IMPRESSION(S) / ED DIAGNOSES  Final diagnoses:  ST elevation myocardial infarction (STEMI), unspecified artery ([place])    Note:  This document was prepared using Dragon voice recognition software and may include unintentional dictation  errors.  Nanda Quinton, MD, [facility] Emergency Medicine    Delta Pichon, [facility], MD 08/26/21 [phone]

## 2021-08-26 NOTE — Telephone Encounter (Signed)
Pharmacy Patient Advocate Encounter  Insurance verification completed.    The patient is insured through [employer] Part D   The patient is currently admitted and ran test claims for the following: Brilinta 90 mg.  Copays and coinsurance results were relayed to Inpatient clinical team.

## 2021-08-26 NOTE — ED Triage Notes (Signed)
Pt arrived via [facility] for [facility]. Pt reported sudden onset non-radiating, center chest pain starting at approximately 2200 with nausea/vomiting, diarrhea, and diaphoresis. EMS administered 324 ASA, 1 SL Nitro, and 546m NS via 20g 18g in left AC. GCS 15, A&Ox4. PMH DVTs.   BP 100/60 HR 58 RR 14 SPO2 98%

## 2021-08-26 NOTE — Progress Notes (Signed)
Pt had 18 beat run of VT.  Dr. Einar Gip was notified.  No new orders given.  VVS and no further runs of VT noticed.  Patient also extremely adamant about going home today.  " Have too much to do!"  I did inform her the MD would be around to see her soon on rounds. Will continue to monitor for any changes.

## 2021-08-26 NOTE — ED Notes (Signed)
To Cath lab with RNs

## 2021-08-26 NOTE — Progress Notes (Signed)
Subjective:  No specific complaints, wants to go home.  Intake/Output from previous day:  No intake/output data recorded. No intake/output data recorded. Net IO Since Admission: No IO data has been entered for this period [08/26/21 0754]  Blood pressure 105/64, pulse 69, temperature 98 F (36.7 C), temperature source Oral, resp. rate (!) 23, height '5\' 2"'  (1.575 m), weight 42 kg, SpO2 99 %. Physical Exam Constitutional:      Appearance: She is underweight.  Neck:     Vascular: No JVD.  Cardiovascular:     Rate and Rhythm: Normal rate and regular rhythm.     Pulses: Intact distal pulses.     Heart sounds: Normal heart sounds. No murmur heard.    No gallop.     Comments: Right forearm mild ecchymosis noted. Pulmonary:     Effort: Pulmonary effort is normal.     Breath sounds: Normal breath sounds.  Abdominal:     General: Bowel sounds are normal.     Palpations: Abdomen is soft.  Musculoskeletal:     Right lower leg: No edema.     Left lower leg: No edema.     Lab Results: BMP BNP (last 3 results) No results for input(s): "BNP" in the last 8760 hours.  ProBNP (last 3 results) No results for input(s): "PROBNP" in the last 8760 hours.    Latest Ref Rng & Units 08/26/2021   12:33 AM 08/26/2021   12:27 AM 11/14/2019    8:35 AM  BMP  Glucose 70 - 99 mg/dL 134  142  112   BUN 8 - 23 mg/dL '13  10  6   ' Creatinine 0.44 - 1.00 mg/dL 0.60  0.75  0.68   BUN/Creat Ratio 12 - 28   9   Sodium 135 - 145 mmol/L 140  139  139   Potassium 3.5 - 5.1 mmol/L 3.8  3.9  4.5   Chloride 98 - 111 mmol/L 103  107  103   CO2 22 - 32 mmol/L  23  23   Calcium 8.9 - 10.3 mg/dL  8.4  9.2       Latest Ref Rng & Units 08/26/2021   12:27 AM  Hepatic Function  Total Protein 6.5 - 8.1 g/dL 5.1   Albumin 3.5 - 5.0 g/dL 3.3   AST 15 - 41 U/L 20   ALT 0 - 44 U/L 14   Alk Phosphatase 38 - 126 U/L 129   Total Bilirubin 0.3 - 1.2 mg/dL 0.4       Latest Ref Rng & Units 08/26/2021    6:04 AM 08/26/2021    12:33 AM 08/26/2021   12:27 AM  CBC  WBC 4.0 - 10.5 K/uL 10.5   9.5   Hemoglobin 12.0 - 15.0 g/dL 13.2  12.6  12.7   Hematocrit 36.0 - 46.0 % 37.7  37.0  38.2   Platelets 150 - 400 K/uL 227   217    Lab Results  Component Value Date   CHOL 147 08/26/2021   HDL 25 (L) 08/26/2021   LDLCALC 103 (H) 08/26/2021   TRIG 94 08/26/2021   CHOLHDL 5.9 08/26/2021     HEMOGLOBIN A1C Lab Results  Component Value Date   HGBA1C 5.4 08/26/2021   MPG 108.28 08/26/2021   TSH No results for input(s): "TSH" in the last 8760 hours.  Cardiac Panel (last 3 results) Recent Labs    08/26/21 0027  TROPONINIHS 235*   Scheduled Meds:  [START ON 08/27/2021] aspirin  81 mg Oral Daily   atorvastatin  40 mg Oral Daily   feeding supplement  237 mL Oral BID BM   ferrous sulfate  324 mg Oral Q breakfast   losartan  25 mg Oral QPM   metoprolol succinate  25 mg Oral Daily   sodium chloride flush  3 mL Intravenous Q12H   [START ON 08/27/2021] ticagrelor  90 mg Oral BID   Continuous Infusions:  sodium chloride     sodium chloride 1 mL/kg/hr (08/26/21 0227)   PRN Meds:.sodium chloride, acetaminophen, ondansetron (ZOFRAN) IV, sodium chloride flush  Radiology:   Imaging Results (Last 48 hours)'[]' Expand by Default  DG Chest Port 1 View   Result Date: 08/26/2021 CLINICAL DATA:  Chest pain EXAM: PORTABLE CHEST 1 VIEW COMPARISON:  08/25/2018 FINDINGS: Mild hyperinflation. Biapical scarring. No confluent airspace opacities or effusions. Heart is normal size. IMPRESSION: Hyperinflation, chronic changes.  No active disease. Electronically Signed   By: Rolm Baptise M.D.   On: 08/26/2021 00:45     Cardiac Studies:    Echocardiogram 08/25/2018:  1. The left ventricle has hyperdynamic systolic function, with an ejection fraction of >65%. The cavity size was normal. Left ventricular diastolic Doppler parameters are consistent with impaired relaxation  08/26/2021: Normal sinus rhythm at rate of 69 bpm.  Poor R  wave progression, cannot exclude anteroseptal infarct old.  No evidence of ischemia.  Low-voltage complexes.  Pulmonary disease pattern.  08/25/2021, anterior ST elevation suggestive of injury pattern not present.   Assessment    1.  Acute anterior STEMI 2.  History of DVT and PE in 2020, presently off anticoagulation 3.  Tobacco use disorder 4.  Melena 5.  NSVT for 11 seconds   Recommendations:   Patient wanted to walk out AMA and also wanted to go home.  I discussed with her extensively and convinced her to stay for additional 24 hours of observation.  She has had 1 episode of NSVT asymptomatic.  Hemodynamics remained stable, she is presently on low-dose beta-blockers and also ARB and her blood pressure is soft and no space to titrate them up.  With regard to coronary artery disease, no recurrence of angina pectoris, EKG is essentially normalized with regard to ST elevation.  Echocardiogram is pending.  She is now on a low-dose statin, she is very reluctant taking any medication, I had extensive discussion with her the importance of taking dual antiplatelet therapy.  She had complained of dark stool, she was scheduled for GI evaluation, stool occult blood card has been given to the patient.  We will continue to follow.  She can be transferred to the floor, she is willing to stay for additional 24 hours, phase 1 cardiac rehab today and potential discharge tomorrow.  We will replace potassium as it was borderline low.   Adrian Prows, MD, [facility] 08/26/2021, 7:54 AM Office: [phone] Fax: [phone] Pager: [phone]

## 2021-08-26 NOTE — Consult Note (Signed)
   [facility] Inpatient Consult   08/26/2021  Leslie Cross 05-18-1950 [id number]  [place] [facility] [ACO] Patient: [employer] Medicare  Referral: Inpatient [facility] RNCM   Primary Care Provider:  Caren Macadam, MD, is [name] at [facility], is an embedded provider with a Chronic Care Management team and program, and is listed for the transition of care follow up and appointments.  Patient was screened for Embedded practice service needs for chronic care management  Plan: A referral can be sent to the [place] Management and made aware of TOC needs for post hospital needs. Will continue to follow, patient is currently in ICU>  Please contact for further questions,  Natividad Brood, RN BSN [facility] Liaison  [phone] business mobile phone Toll free office [phone]  Fax number: [phone] [email or web address]

## 2021-08-26 NOTE — Plan of Care (Signed)
  Problem: Pain Managment: Goal: General experience of comfort will improve Outcome: Progressing   Problem: Education: Goal: Understanding of cardiac disease, CV risk reduction, and recovery process will improve Outcome: Progressing Goal: Understanding of medication regimen will improve Outcome: Progressing Goal: Individualized Educational Video(s) Outcome: Progressing   Problem: Cardiac: Goal: Ability to achieve and maintain adequate cardiopulmonary perfusion will improve Outcome: Progressing Goal: Vascular access site(s) Level 0-1 will be maintained Outcome: Progressing

## 2021-08-26 NOTE — Progress Notes (Addendum)
CARDIAC REHAB PHASE I   PRE:  Rate/Rhythm: 71 NSR  BP:  Sitting: 101/54      SaO2: 99 RA  MODE:  Ambulation: 370 ft   POST:  Rate/Rhythm: 100 NSR  BP:  Sitting: 103/58      SaO2: 100 RA   Seen pt from 1330-1425 pt was ambulated in hallway with standby assistance w/o complaint. PVCs were noticed during ambulation. Pt was returned to room and educated on MI book, stent procedure, restrictions, Aspirin & Birlinta med use, ex guidelines, NTG use, heart healthy diet, smoking cessation and CRPII. Pt is being referred to CRPII at [facility]    Leslie Cross  2:23 PM 08/26/2021

## 2021-08-26 NOTE — H&P (Signed)
CARDIOLOGY ADMIT NOTE   Patient ID: Leslie Cross MRN: [id number] DOB/AGE: 1950/08/02 71 y.o.  Admit date: 08/26/2021 Primary Physician:  Charlott Rakes, MD  Patient ID: Leslie Cross, female    DOB: [birth year], 71 y.o.   MRN: [id number]  Chief Complaint  Patient presents with   Code STEMI   HPI:    Leslie Cross  is a 71 y.o. 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  EKG revealed anterior STEMI, she is now being brought emergently to the cardiac catheterization lab.  Still has chest pain.  Presently on iron pills as she was having dark stools and had recently seen GI and was found to have colonic polyps by prior history and was planned for colonoscopy soon.  Otherwise no other specific complaints today.  Past Medical History:  Diagnosis Date   Pulmonary emboli ([place]) 08/29/2018   Recurrent acute deep vein thrombosis (DVT) of lower extremity ([place]) 12/12/2018   No past surgical history on file. Social History   Socioeconomic History   Marital status: Single    Spouse name: Not on file   Number of children: Not on file   Years of education: Not on file   Highest education level: Not on file  Occupational History   Not on file  Tobacco Use   Smoking status: Every Day    Packs/day: 0.50    Types: Cigarettes   Smokeless tobacco: Never  Vaping Use   Vaping Use: Never used  Substance and Sexual Activity   Alcohol use: No   Drug use: No   Sexual activity: Not on file  Other Topics Concern   Not on file  Social History Narrative   Not on file   Social Determinants of Health   Financial Resource Strain: Unknown (08/25/2018)   Overall Financial Resource Strain  (CARDIA)    Difficulty of Paying Living Expenses: Patient refused  Food Insecurity: Unknown (08/25/2018)   Hunger Vital Sign    Worried About Running Out of Food in the Last Year: Patient refused    [place] in the Last Year: Patient refused  Transportation Needs: Unknown (08/25/2018)   PRAPARE - Transportation    Lack of Transportation (Medical): Patient refused    Lack of Transportation (Non-Medical): Patient refused  Physical Activity: Unknown (08/25/2018)   Exercise Vital Sign    Days of Exercise per Week: Patient refused    Minutes of Exercise per Session: Patient refused  Stress: Not on file  Social Connections: Unknown (08/25/2018)   Social Connection and Isolation Panel [NHANES]    Frequency of Communication with Friends and Family: Patient refused    Frequency of Social Gatherings with Friends and Family: Patient refused    Attends Religious Services: Patient refused    Active Member of Clubs or Organizations: Patient refused    Attends Archivist Meetings: Patient refused    Marital Status: Patient refused  Intimate Partner Violence: Unknown (08/25/2018)   Humiliation, Afraid, Rape, and Kick questionnaire    Fear of Current or Ex-Partner: Patient refused    Emotionally Abused: Patient refused    Physically Abused: Patient refused    Sexually Abused: Patient refused   Family History  Problem  Relation Age of Onset   CAD Neg Hx    Diabetes Mellitus I Neg Hx     ROS  Review of Systems  Cardiovascular:  Positive for chest pain. Negative for dyspnea on exertion and leg swelling.  Gastrointestinal:  Positive for abdominal pain and melena.   Objective      08/26/2021    1:37 AM 08/26/2021    1:32 AM 08/26/2021    1:27 AM  Vitals with BMI  Systolic 96 91 998  Diastolic 50 60 62  Pulse 74 76 74      Physical Exam Constitutional:      Appearance: She is underweight.  Neck:     Vascular: No JVD.  Cardiovascular:     Rate and Rhythm: Normal rate and  regular rhythm.     Pulses: Intact distal pulses.     Heart sounds: Normal heart sounds. No murmur heard.    No gallop.  Pulmonary:     Effort: Pulmonary effort is normal.     Breath sounds: Normal breath sounds.  Abdominal:     General: Bowel sounds are normal.     Palpations: Abdomen is soft.  Musculoskeletal:     Right lower leg: No edema.     Left lower leg: No edema.    Laboratory examination:   Recent Labs    08/26/21 0027 08/26/21 0033  NA 139 140  K 3.9 3.8  CL 107 103  CO2 23  --   GLUCOSE 142* 134*  BUN 10 13  CREATININE 0.75 0.60  CALCIUM 8.4*  --   GFRNONAA >60  --    estimated creatinine clearance is 43.6 mL/min (by C-G formula based on SCr of 0.6 mg/dL).     Latest Ref Rng & Units 08/26/2021   12:33 AM 08/26/2021   12:27 AM 11/14/2019    8:35 AM  CMP  Glucose 70 - 99 mg/dL 134  142  112   BUN 8 - 23 mg/dL '13  10  6   '$ Creatinine 0.44 - 1.00 mg/dL 0.60  0.75  0.68   Sodium 135 - 145 mmol/L 140  139  139   Potassium 3.5 - 5.1 mmol/L 3.8  3.9  4.5   Chloride 98 - 111 mmol/L 103  107  103   CO2 22 - 32 mmol/L  23  23   Calcium 8.9 - 10.3 mg/dL  8.4  9.2   Total Protein 6.5 - 8.1 g/dL  5.1    Total Bilirubin 0.3 - 1.2 mg/dL  0.4    Alkaline Phos 38 - 126 U/L  129    AST 15 - 41 U/L  20    ALT 0 - 44 U/L  14        Latest Ref Rng & Units 08/26/2021   12:33 AM 08/26/2021   12:27 AM 08/21/2020    9:56 AM  CBC  WBC 4.0 - 10.5 K/uL  9.5  5.2   Hemoglobin 12.0 - 15.0 g/dL 12.6  12.7  14.0   Hematocrit 36.0 - 46.0 % 37.0  38.2  41.2   Platelets 150 - 400 K/uL  217  302    Lipid Panel     Component Value Date/Time   CHOL 147 08/26/2021 0027   CHOL 162 11/14/2019 0835   TRIG 94 08/26/2021 0027   HDL 25 (L) 08/26/2021 0027   HDL 37 (L) 11/14/2019 0835   CHOLHDL 5.9 08/26/2021 0027   VLDL 19 08/26/2021 0027  LDLCALC 103 (H) 08/26/2021 0027   LDLCALC 111 (H) 11/14/2019 0835   HEMOGLOBIN A1C Lab Results  Component Value Date   HGBA1C 5.4 08/26/2021    MPG 108.28 08/26/2021   TSH No results for input(s): "TSH" in the last 8760 hours. BNP (last 3 results) No results for input(s): "BNP" in the last 8760 hours. Cardiac Panel (last 3 results) Recent Labs    08/26/21 0027  TROPONINIHS 235*     Medications and allergies   Allergies  Allergen Reactions   Cephalexin Anaphylaxis   Erythromycin Base Anaphylaxis   Other Other (See Comments)    All Cillins, Mycins  No meat   Penicillins Other (See Comments)    Did it involve swelling of the face/tongue/throat, SOB, or low BP? Unknown Did it involve sudden or severe rash/hives, skin peeling, or any reaction on the inside of your mouth or nose? Unknown Did you need to seek medical attention at a hospital or doctor's office? Unknown When did it last happen?      10 years If all above answers are "NO", may proceed with cephalosporin use.      sodium chloride      Current Outpatient Medications  Medication Instructions   ferrous sulfate 324 mg, Oral, Daily with breakfast   Radiology:  DG Chest Port 1 View  Result Date: 08/26/2021 CLINICAL DATA:  Chest pain EXAM: PORTABLE CHEST 1 VIEW COMPARISON:  08/25/2018 FINDINGS: Mild hyperinflation. Biapical scarring. No confluent airspace opacities or effusions. Heart is normal size. IMPRESSION: Hyperinflation, chronic changes.  No active disease. Electronically Signed   By: Rolm Baptise M.D.   On: 08/26/2021 00:45    Cardiac Studies:   NA  Assessment   1.  Acute anterior STEMI 2.  History of DVT and PE in 2020, presently off anticoagulation 3.  Tobacco use disorder 4.  Melena  Recommendations:  Patient will be taken emergently to the cardiac catheterization lab.  Patient has clear-cut ST elevation in V2 through V4.  She has ongoing chest pain.  Patient is willing to proceed.  Emergency timeout will be performed.  We will manage her risk factors including tobacco use disorder appropriately postprocedure.  We will obtain stool  guaiac.   Adrian Prows, MD, [facility] 08/26/2021, 2:01 AM Office: [phone] Fax: [phone] Pager: [phone]

## 2021-08-26 NOTE — TOC Benefit Eligibility Note (Signed)
Patient Teacher, English as a foreign language completed.    The patient is currently admitted and upon discharge could be taking Brilinta 90 mg.  The current 30 day co-pay is, $47.00.   The patient is insured through [place], [place] Patient Advocate Specialist [place] Patient Advocate Team Direct Number: [phone]  Fax: [phone]

## 2021-08-26 NOTE — Progress Notes (Signed)
Echocardiogram 2D Echocardiogram has been performed.  Oneal Deputy Isabellamarie Randa RDCS 08/26/2021, 11:25 AM

## 2021-08-26 NOTE — Progress Notes (Signed)
Initial Nutrition Assessment  DOCUMENTATION CODES:   Severe malnutrition in context of chronic illness, Underweight  INTERVENTION:  Liberalize diet from a heart healthy to a regular diet to provide widest variety of menu options to enhance nutritional adequacy Provide snacks TID to increase nutritional adequacy MVI with minerals daily  NUTRITION DIAGNOSIS:   Severe Malnutrition related to chronic illness as evidenced by energy intake < or equal to 75% for > or equal to 1 month, severe fat depletion, severe muscle depletion, moderate muscle depletion.  GOAL:   Patient will meet greater than or equal to 90% of their needs  MONITOR:   PO intake, Supplement acceptance, Labs, Weight trends  REASON FOR ASSESSMENT:   Malnutrition Screening Tool    ASSESSMENT:   Pt admitted d/t code STEMI. PMH significant for chronic B12 deficiency, unprovoked DVT and pulmonary emboli (08/2018).  Food allergies: red meat and pork  Pt sitting in bed at time of visit with family member present at bedside. She reports doing well today and wanting to go home. She denies changes or decreased to her usual PO intake. Her home intake includes 1 meal. She usually prepares her meals at home as she is unable to eat red meat or pork d/t [place] tick bite and is concerned with cross contamination. She recalls eating chicken with onions and vegetables.   Pt is familiar with nutrition supplements but declines Ensure, Boost and Carnation Instant Breakfast at this time. She is agreeable to [employer], however unable to order as these supplements contain gelatin from pork/beef sources. At home she takes Vitamin C and Vitamin D. Will add a MVI during admission. She was hesitant but agreeable.   She works for [employer] full time and is typically active, climbing ladders.  Reports having a medical problem that was supposed to be taken care of regarding her digestive system and endorses altered bowel habits but did not  want to provide details at this time. Per review of chart, recently seen outpt by GI and found to have colonic polyps, planned for colonoscopy.   Reports that she has always been small her whole life but states that she has had a recent wt loss of about 7 lbs. Unfortunately, there is limited documentation of wt history on file within the last year. Within the last 3 years, her wt has remained 44-45 kg. Current admit wt noted to be 42 kg, uncertain how recent this wt loss has occurred.   She would benefit from a liberalized diet at this time as she is only eating 1 meal per day. Once meal intake improves, can adjust diet to a more heart healthy diet. Will also order snacks TID to offer throughout the day to help encourage increased nutritional intake.   Medications: ferrous sulfate  Labs: BUN 7, ionized calcium 1.04, alkaline phosphatase 129, HDL 25, LDL 103  NUTRITION - FOCUSED PHYSICAL EXAM:  Flowsheet Row Most Recent Value  Orbital Region Severe depletion  Upper Arm Region Severe depletion  Thoracic and Lumbar Region Severe depletion  Buccal Region Moderate depletion  Temple Region Moderate depletion  Clavicle Bone Region Severe depletion  Clavicle and Acromion Bone Region Severe depletion  Scapular Bone Region Severe depletion  Dorsal Hand Moderate depletion  Patellar Region Severe depletion  Anterior Thigh Region Severe depletion  Posterior Calf Region Moderate depletion  Edema (RD Assessment) None  Hair Reviewed  Eyes Reviewed  Mouth Reviewed  Skin Reviewed  Nails Reviewed       Diet  Order:   Diet Order             Diet regular Room service appropriate? Yes; Fluid consistency: Thin  Diet effective now                   EDUCATION NEEDS:   Education needs have been addressed  Skin:  Skin Assessment: Reviewed RN Assessment  Last BM:  PTA  Height:   Ht Readings from Last 1 Encounters:  08/26/21 '5\' 2"'$  (1.575 m)    Weight:   Wt Readings from Last 1  Encounters:  08/26/21 42 kg    Ideal Body Weight:  50 kg  BMI:  Body mass index is 16.94 kg/m.  Estimated Nutritional Needs:   Kcal:  1500-1700  Protein:  70-85g  Fluid:  >/=1.5L  Clayborne Dana, RDN, LDN Clinical Nutrition

## 2021-08-27 ENCOUNTER — Other Ambulatory Visit (HOSPITAL_COMMUNITY): Payer: Self-pay

## 2021-08-27 ENCOUNTER — Telehealth (HOSPITAL_COMMUNITY): Payer: Self-pay | Admitting: Pharmacy Technician

## 2021-08-27 ENCOUNTER — Other Ambulatory Visit: Payer: Self-pay | Admitting: Cardiology

## 2021-08-27 ENCOUNTER — Encounter: Payer: Self-pay | Admitting: Cardiology

## 2021-08-27 DIAGNOSIS — Z006 Encounter for examination for normal comparison and control in clinical research program: Secondary | ICD-10-CM

## 2021-08-27 DIAGNOSIS — I2109 ST elevation (STEMI) myocardial infarction involving other coronary artery of anterior wall: Secondary | ICD-10-CM

## 2021-08-27 DIAGNOSIS — E43 Unspecified severe protein-calorie malnutrition: Secondary | ICD-10-CM | POA: Insufficient documentation

## 2021-08-27 LAB — ECHOCARDIOGRAM COMPLETE
Area-P 1/2: 4.21 cm2
Calc EF: 41.3 %
Height: 62 in
S' Lateral: 2.4 cm
Single Plane A2C EF: 39.3 %
Single Plane A4C EF: 36.7 %
Weight: 1481.49 oz

## 2021-08-27 LAB — LIPOPROTEIN A (LPA): Lipoprotein (a): 12.5 nmol/L (ref <75.0)

## 2021-08-27 MED ORDER — TICAGRELOR 90 MG PO TABS
90.0000 mg | ORAL_TABLET | Freq: Two times a day (BID) | ORAL | 0 refills | Status: DC
  Filled 2021-08-27: qty 60, 30d supply, fill #0

## 2021-08-27 MED ORDER — ASPIRIN 81 MG PO CHEW
81.0000 mg | CHEWABLE_TABLET | Freq: Every day | ORAL | 11 refills | Status: AC
  Filled 2021-08-27: qty 30, 30d supply, fill #0
  Filled 2021-09-24: qty 30, 30d supply, fill #1

## 2021-08-27 MED ORDER — ATORVASTATIN CALCIUM 40 MG PO TABS
40.0000 mg | ORAL_TABLET | Freq: Every day | ORAL | 2 refills | Status: DC
  Filled 2021-08-27: qty 30, 30d supply, fill #0
  Filled 2021-09-24: qty 30, 30d supply, fill #1

## 2021-08-27 MED ORDER — STUDY - EVOLVE-MI - EVOLOCUMAB (REPATHA) 140 MG/ML SQ INJECTION (PI-STUCKEY)
140.0000 mg | INJECTION | SUBCUTANEOUS | Status: DC
  Administered 2021-08-27: 140 mg via SUBCUTANEOUS
  Filled 2021-08-27: qty 1

## 2021-08-27 MED ORDER — METOPROLOL SUCCINATE ER 25 MG PO TB24
25.0000 mg | ORAL_TABLET | Freq: Every day | ORAL | 2 refills | Status: DC
  Filled 2021-08-27: qty 30, 30d supply, fill #0

## 2021-08-27 MED ORDER — LOSARTAN POTASSIUM 25 MG PO TABS
25.0000 mg | ORAL_TABLET | Freq: Every evening | ORAL | 2 refills | Status: DC
  Filled 2021-08-27: qty 30, 30d supply, fill #0
  Filled 2021-09-24: qty 30, 30d supply, fill #1

## 2021-08-27 MED ORDER — STUDY - EVOLVE-MI - EVOLOCUMAB (REPATHA) 140 MG/ML SQ INJECTION (PI-STUCKEY): 140.0000 mg | INJECTION | SUBCUTANEOUS | 0 refills | Status: DC

## 2021-08-27 MED ORDER — NITROGLYCERIN 0.4 MG SL SUBL: 0.4000 mg | SUBLINGUAL_TABLET | SUBLINGUAL | 3 refills | Status: AC | PRN

## 2021-08-27 MED FILL — Lidocaine HCl Local Preservative Free (PF) Inj 1%: INTRAMUSCULAR | Qty: 30 | Status: AC

## 2021-08-27 NOTE — Progress Notes (Signed)
08/27/2021 10:33 AM Discharge AVS meds taken today and those due this evening reviewed.  Follow-up appointments and when to call md reviewed.  D/C IV and TELE.  Questions and concerns addressed.   D/C home per orders.  Carney Corners

## 2021-08-27 NOTE — TOC Benefit Eligibility Note (Signed)
Patient Teacher, English as a foreign language completed.    The patient is currently admitted and upon discharge could be taking Entresto 24-26 mg.  The current 30 day co-pay is, $47.00.   The patient is currently admitted and upon discharge could be taking Farxiga 10 mg.  The current 30 day co-pay is, $47.00.   The patient is currently admitted and upon discharge could be taking Jardiance 10 mg.  The current 30 day co-pay is, $47.00.   The patient is insured through [place], [place] Patient Advocate Specialist [place] Patient Advocate Team Direct Number: [phone]  Fax: [phone]

## 2021-08-27 NOTE — Plan of Care (Signed)
?  Problem: Clinical Measurements: ?Goal: Will remain free from infection ?Outcome: Progressing ?  ?

## 2021-08-27 NOTE — Progress Notes (Signed)
Seen pt from [phone], pt is ambulating independently. Initiated teach back education for restrictions, Birlinta use, exercise, and diet. During education pt began bleeding and nurse was called to bandage pt up. All questions were answered prior to my departure.  Christen Bame 08/27/2021 10:46 AM

## 2021-08-27 NOTE — Telephone Encounter (Signed)
Pharmacy Patient Advocate Encounter  Insurance verification completed.    The patient is insured through [employer] Medicare Part D   The patient is currently admitted and ran test claims for the following: Entresto, Farxiga, Jardiance.  Copays and coinsurance results were relayed to Inpatient clinical team. 

## 2021-08-27 NOTE — Research (Addendum)
Evolve  MI Informed Consent   Subject Name: Leslie Cross  Subject met inclusion and exclusion criteria.  The informed consent form, study requirements and expectations were reviewed with the subject and questions and concerns were addressed prior to the signing of the consent form.  The subject verbalized understanding of the trial requirements.  The subject agreed to participate in the Evolve MI trial and signed the informed consent at [place] on 08/27/2021.  The informed consent was obtained prior to performance of any protocol-specific procedures for the subject.  A copy of the signed informed consent was given to the subject and a copy was placed in the subject's medical record.   Enid Derry Malic Rosten      Protocol # [id number]  Subject Initial ID#:  [id number]                      Age in years: 32  *Demographics are found in the [place] EMR source.   Protocol Version: v2.00 [id number]  Were all Eligibility Criteria Met?  _0  Yes  _1   No  Screening Visit Date:            13/Jul/2023  10.2 Targeted Medical History:  Qualifying Acute Myocardial Infarction Event  _2  NSTEMI  _3  STEMI  Admission Date For Index Myocardial Infarction:    26-Aug-2021  Initial Management Strategy: _4  Medical Management ONLY     _5  Invasive Mgmt (including Lysis)  Diagnostic Coronary Angiography Performed for Index MI Event? _6  Yes _7  No   Diagnostic Coronary Angiography Performed For Index Myocardial Infarction Event (Select All That Apply)    _8  Invasive Coronary Angiography         _9  Computed Tomography (CT) angiography   Coronary Revascularization Performed For Index Myocardial Infarction Event?    _10  Yes  _11  No  Coronary Revascularization Performed For Index Myocardial Infarction Event (Select All That Apply)  _12 Percutaneous Coronary Intervention (PCI)                                        _13  Coronary Artery Bypass Graft (CABG)                  _14  Fibrinolysis  Ejection Fraction Measured?   _15  Yes  _16   No  Ejection Fraction (%)  40   Peak Troponin Level For Index Myocardial Infarction Event? 235 Peak Troponin Unit For Index Myocardial Infarction Event?  ng/L Troponin Normal Range Upper Limit    _18__  History of Multi-vessel Coronary Artery Disease (Including defined during index myocardial infarction event)   _17  Yes  _18   No  Cardiovascular and Other Medical History History of Acute Myocardial Infarction Prior To Index Event   _19  Yes _20  No  History of Coronary Artery Bypass Graft (CABG) _21  Yes  _22   No  History of Percutaneous Coronary Intervention (PCI)  _23  Yes  _24   No  History Of Hypertension  _25  Yes  _26   No  History of Diabetes Mellitus  _27  Yes  _28   No  History of Peripheral Artery Disease (Lower Extremity, Carotid Disease)                            _29  Yes  _30   No  History of Cerebrovascular Disease   (Prior Stroke, Transient Ischemic Attack (TIA))  _31  Yes  _32   No  History of Lower Extremity Revascularization  _0  Yes  _1   No  History of Major Lower Extremity Amputation At Or Above The Level Of The Ankle                        _2  Yes  _3   No  History of Smoking Tobacco  _4  Never Smoked     _5  Former Smoker     _6  Current Smoker     _7  Unknown  VITAL SIGNS (after Consent is signed) Height: 62         _8  centimeters  _9   Inches  Weight : 92     _10  Kilograms  <[id number]>_0<\/[id number]>_34  Lbs   Systolic Blood Pressure (mmHg)    98  (Most recent Prior to Randomization)  Diastolic Blood Pressure (mmHg)    50  (Most recent Prior to Randomization)       10.4 Laboratory Assessments - Baseline Baseline Laboratory Assessments Performed? _12  Yes _13   No  Lipid Panel Lipid Panel available?  _14  Yes  _15   No Lipid Panel Date?       26-Aug-2021   Total Cholesterol Result? 147   Unit: _16   mg/dL   _17  mmol/L     High Density Lipoprotein Cholesterol (HDL-C) Result? 25         Unit: _18   mg/dL   _19  mmol/L     Triglycerides?  94   Unit: _20   mg/dL   _21  mmol/L                Low Density Lipoprotein Cholesterol (LDL-C) 103          Unit: _22   mg/dL   _23  mmol/L           Apolipoprotein A1 (Apo A1)    _24   Yes  _25   No   Result if yes:_____   Apolipoprotein B (ApoB)   _26   Yes  _27   No    Result if yes:______   Lipoprotein(a) Available?   _28   Yes  _29   No      Date:     26-Aug-2021      Result:  12.5     Unit:  nmol/L  Other Labs:   Creatinine Available?   _30   Yes  _31   No      Date:     26-Aug-2021      Result:  0.75     Unit:  mg/dL   Estimated Glomerular Filtration Rate (eGFR) Available?   _32   Yes  _33   No      Date:     26-Aug-2021      Result:  >60      Unit:  mL/min/1.50m   Alanine Aminotransferase (ALT) / Serum Glutamic-Pyruvic Transaminase (SGPT)     Available?   _34   Yes  _35   No       Date:     26-Aug-2021      Result:  14     Unit:  U/L      Normal Range: Lower Limit;    0      Normal Range: Upper Limit:   44   Aspartate Aminotransferase (AST) / Serum Glutamic-Oxaloacetic Transaminase (SGOT)     Available?   _36   Yes  _37   No       Date:     26-Aug-2021      Result:  20      Unit:  U/L  Normal Range: Lower Limit;   15      Normal Range: Upper Limit:   41   Total Bilirubin   Available?   _0   Yes  _1   No       Date:   26-Aug-2021      Result:  0.4     Unit:  mg/dL       Normal Range: Lower Limit;   0.3      Normal Range: Upper Limit: 1.2     Glucose Available?   _2   Yes  _3   No       Date: 26-Aug-2021      Result:  94     Unit:  mg/dL     Normal Range: Lower Limit;  70     Normal Range: Upper Limit: 99  Hemoglobin A1c (HbA1c) Available?   _4   Yes  _5   No       Date:   26-Aug-2021      Result:  5.4  (%)      Hemoglobin Available?   _6   Yes  _7   No    Date: 26-Aug-2021       Hgb Level   12.7  Unit:  g/dL  White Blood Cell Count Available?   _8   Yes  _9   No   Date: 26-Aug-2021      WBC count:  9.5    Unit:  1000/mmm3  Platelet Available?   _10   Yes  _11   No       Date:    26-Aug-2021       Platelet count:  217   Unit:   1000/mmm3      High-Sensitivity C-Reactive Protein (hsCRP).?_12   Yes  _13   No      Result if Yes,  ____  Very Low Density Lipoprotein Cholesterol (LDL-C)? _14  Yes  _15  No     Result if Yes, ____    [id number]  Randomization:   Geographic Region:    [place]  Randomization Date:     26-Aug-2021  Randomization Time:  08:37     Treatment type Assigned   _16  Treatment                                   _17   Control             Protocol # [id number]   Subject ID#   [id number]                   DAY 1 Date:       27-Aug-2021     Initial Study Treatment- Evolocumab Self-administration  Training for Evolocumab Self-Administration Completed?  _18  YES  _19   NO  Training for Evolocumab Self-Administration Date:   27-Aug-2021  Evolocumab Administered?  _20  YES  _21   NO  Evolocumab Start Date: 26-Aug-2021  Lot Number of Initial Dose Administered: [id number]  Additional Lot Number Distributed to Subject: [id number]  Additional Lot Number Distributed to Subject: [id number]  Additional Lot Number Distributed to Subject: [id number]  Additional Lot Number Distributed to Subject: [id number]  Additional Lot Number Distributed to Subject: - [id number]

## 2021-08-27 NOTE — Discharge Summary (Addendum)
Physician Discharge Summary  Patient ID: Leslie Cross MRN: [id number] DOB/AGE: 1950/10/21 71 y.o. Leslie Macadam, MD   Admit date: 08/26/2021 Discharge date: 08/27/2021  Primary Discharge Diagnosis Acute anterior ST elevation myocardial infarction CAD of native vessel with unstable angina Hypercholesterolemia Tobacco use disorder NSVT Ischemic cardiomyopathy with moderate LV systolic dysfunction Protein calorie malnutrition, Moderate to severe  Significant Diagnostic Studies:  Cardiac Studies:    Left Heart Catheterization 08/26/21:  LV: Hemodynamics: 107/45, EDP 16 mmHg.  Ao 116/64 mmHg.  There was no pressure gradient across the aortic valve. Mild to moderate decrease in LVEF, 40 to 45% with mid to distal anterior and apical and apical inferior akinesis. RCA: Very large caliber vessel and a dominant vessel.  Very minimal disease is evident.  Large PDA. LM: Moderate caliber vessel.  No significant disease. LAD: Gives origin to large D1, followed by thrombotic occlusion.  TIMI 0 flow.  There is a small D2 and several small diagonals distally. CX: Proximal circumflex has a 50 to 60% irregular stenosis.  Moderate-sized OM1 with mild disease.  Interventional data: Successful thrombectomy with a Pronto 5.5 with extraction of significant amount of thrombus followed by direct stenting with 2.5 x 28 mm Synergy XT DES at 16 atmospheric pressure, stenosis reduced from 100% to 0% with TIMI 0 to TIMI-3 flow.  115 mL contrast utilized.    Echocardiogram 08/26/2021:  1. Left ventricular ejection fraction, by estimation, is 35 to 40%. Left ventricular ejection fraction by 2D MOD biplane is 41.3 %. The left ventricle has moderately decreased function. The left ventricle demonstrates regional wall motion abnormalities  (see scoring diagram/findings for description). Left ventricular diastolic parameters were normal. There is akinesis of the left ventricular, mid-apical anterior wall, apical  segment and septal wall. There is akinesis of the left ventricular, apical  inferior segment.  2. Right ventricular systolic function is normal. The right ventricular size is normal. There is mildly elevated pulmonary artery systolic pressure. The estimated right ventricular systolic pressure is 09.9 mmHg.  3. The mitral valve is normal in structure. Mild mitral valve regurgitation. No evidence of mitral stenosis.  4. The aortic valve is tricuspid. Aortic valve regurgitation is mild.  5. The inferior vena cava is dilated in size with <50% respiratory variability, suggesting right atrial pressure of 15 mmHg.  EKG:    08/26/2021: Normal sinus rhythm at rate of 69 bpm.  Poor R wave progression, cannot exclude anteroseptal infarct old.  No evidence of ischemia.  Low-voltage complexes.  Pulmonary disease pattern.  08/25/2021, anterior ST elevation suggestive of injury pattern not present.   Radiology:    [facility] Chest Port 1 View 08/26/2021 CLINICAL DATA:  Chest pain EXAM: PORTABLE CHEST 1 VIEW COMPARISON:  08/25/2018 FINDINGS: Mild hyperinflation. Biapical scarring. No confluent airspace opacities or effusions. Heart is normal size. IMPRESSION: Hyperinflation, chronic changes.  No active disease. Electronically Signed   By: Rolm Baptise M.D.   On: 08/26/2021 00:45    Hospital Course: Leslie Cross is a 71 y.o. 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  EKG found to have STEMI anterior wall and hence taken emergently to the cardiac catheterization lab.  Underwent successful thrombectomy followed by revascularization  and stenting to the large mid LAD with excellent results.  Patient was observed  for 48 hours in the hospital, patient very eager to be discharged.  Due to sooner than expected stabilization of her medical condition, I decided to discharge her.  I reviewed with her regarding the data about NSVT that she had post angioplasty and also this morning on telemetry had 3 beat NSVT.  She ambulated in the hallway with cardiac rehab without any arrhythmias or symptoms.  She did not want LifeVest.  Patient initially was reluctant to taking any medications.  I had a very long and lengthy discussion with regarding compliance and she appears to be more compliant.  We also discussed   Protocol # [id number], PCSK9 inhibitor versus observation with continued standard of care therapy with statins in acute MI patient's.  She signed up for the trial.  Smoking cessation was discussed at length.  She appears to be motivated, states that she will start smoking about 2 cigarettes a day and eventually give up on cigarettes completely. NTG Rx sent separately.   Recommendations on discharge: Aspirin indefinitely and Brilinta for a period of 1 year.  Blood pressure is soft, she was started on low-dose beta-blocker metoprolol succinate 25 mg daily and losartan 25 mg in the evening.  Suspect her LVEF will improve as she presented fairly early to the ED and has had excellent results.  She will be followed up in the office in 1 week with TOC visit.  She was also advised to increase her calorie intake.  This was in view of protein calorie malnutrition.  Discharge Exam:    08/27/2021    3:44 AM 08/26/2021   11:51 PM 08/26/2021    9:58 PM  Vitals with BMI  Systolic 98 96 98  Diastolic 50 60 61  Pulse 76 69 67     Physical Exam Neck:     Vascular: No JVD.  Cardiovascular:     Rate and Rhythm: Normal rate and regular rhythm.     Pulses: Intact distal pulses.     Heart sounds: Normal heart sounds. No murmur heard.    No gallop.  Pulmonary:     Effort: Pulmonary effort is normal.     Breath sounds:  Normal breath sounds.  Abdominal:     General: Bowel sounds are normal.     Palpations: Abdomen is soft.  Musculoskeletal:     Right lower leg: No edema.     Left lower leg: No edema.    Labs:   Lab Results  Component Value Date   WBC 10.5 08/26/2021   HGB 13.2 08/26/2021   HCT 37.7 08/26/2021   MCV 96.4 08/26/2021   PLT 227 08/26/2021    Recent Labs  Lab 08/26/21 0027 08/26/21 0033 08/26/21 0604  NA 139   < > 140  K 3.9   < > 3.5  CL 107   < > 110  CO2 23  --  23  BUN 10   < > 7*  CREATININE 0.75   < > 0.54  CALCIUM 8.4*  --  8.6*  PROT 5.1*  --   --   BILITOT 0.4  --   --   ALKPHOS 129*  --   --   ALT 14  --   --   AST 20  --   --   GLUCOSE 142*   < > 109*   < > = values in this interval not displayed.   Lab  Results  Component Value Date   CHOL 147 08/26/2021   HDL 25 (L) 08/26/2021   LDLCALC 103 (H) 08/26/2021   TRIG 94 08/26/2021   CHOLHDL 5.9 08/26/2021        Lipoprotein (a) <75.0 nmol/L 12.5    BNP (last 3 results) No results for input(s): "BNP" in the last 8760 hours.  HEMOGLOBIN A1C Lab Results  Component Value Date   HGBA1C 5.4 08/26/2021   MPG 108.28 08/26/2021    Cardiac Panel (last 3 results) Recent Labs    08/26/21 0027  TROPONINIHS 235*     TSH No results for input(s): "TSH" in the last 8760 hours.  FOLLOW UP PLANS AND APPOINTMENTS Discharge Instructions     AMB Referral to Community Care Coordinaton   Complete by: As directed    Embedded provider:  Caren Macadam, MD, [facility], hospital inpatient [facility] RNCM request   Dx: Acute STEMI Embedded Practice  Patient recently hospitalized. l. Care Coordination Needs: Medication Management  - new medications and Other: low weight nutritional supplements    Please assign to [place] for complex care and disease management follow up calls and assess for further needs.  Questions please call:   Natividad Brood, RN BSN [facility]  Liaison  [phone] business mobile phone Toll free office [phone]  Fax number: [phone] [email or web address]   Reason for Referral: Care Coordination   Disease managment services needed: Nurse Case Manager   Diagnoses of: Other   Expected date of contact: Emergent - 3 Days   Amb Referral to Cardiac Rehabilitation   Complete by: As directed    Diagnosis:  Coronary Stents STEMI     After initial evaluation and assessments completed: Virtual Based Care may be provided alone or in conjunction with Phase 2 Cardiac Rehab based on patient barriers.: Yes      Allergies as of 08/27/2021       Reactions   Alpha-gal Anaphylaxis   Aminoglycosides Anaphylaxis   Beef-derived Products Anaphylaxis   Alpha-gal   Erythromycin Anaphylaxis   Keflex [cephalexin] Anaphylaxis   Other Anaphylaxis, Other (See Comments)   Drugs ending in "cillin" - Anaphylaxis Drugs ending in "mycin" - Anaphylaxis    Pork-derived Products Anaphylaxis   Alpha-gal   Penicillins Other (See Comments)   Did it involve swelling of the face/tongue/throat, SOB, or low BP? Unknown Did it involve sudden or severe rash/hives, skin peeling, or any reaction on the inside of your mouth or nose? Unknown Did you need to seek medical attention at a hospital or doctor's office? Unknown When did it last happen?      10 years If all above answers are "NO", may proceed with cephalosporin use.   Vitamin E Diarrhea   Bloody diarrhea        Medication List     TAKE these medications    Aspirin Low Dose 81 MG chewable tablet Generic drug: aspirin Chew 1 tablet (81 mg total) by mouth daily. Start taking on: August 28, 2021   atorvastatin 40 MG tablet Commonly known as: LIPITOR Take 1 tablet (40 mg total) by mouth daily. Start taking on: August 28, 2021   Brilinta 90 MG Tabs tablet Generic drug: ticagrelor Take 1 tablet (90 mg total) by mouth 2 (two) times daily.   EVOLVE-MI  evolocumab 140 mg/1 mL SQ injection Inject 1 mL (140 mg total) into the skin every 14 (fourteen) days. For Investigational Use Only. Inject subcutaneously into abdomen, thigh,  or upper arm every 14 days. Rotate injection sites and do not inject into areas where skin is tender, bruised, or red. Please contact [facility] for any questions or concerns regarding this medication.   FERROUS SULFATE PO Take 1 tablet by mouth every evening.   losartan 25 MG tablet Commonly known as: COZAAR Take 1 tablet (25 mg total) by mouth every evening.   metoprolol succinate 25 MG 24 hr tablet Commonly known as: TOPROL-XL Take 1 tablet (25 mg total) by mouth daily. Start taking on: August 28, 2021   VITAMIN C PO Take 1 tablet by mouth every evening.   VITAMIN D-3 PO Take 1 capsule by mouth every evening.        Follow-up Information     Alethia Berthold, PA-C Follow up on 09/03/2021.   Specialty: Cardiology Why: 09/03/21 at 1:00 PM. Please arrive 15 min early and bring all medications Contact information: [place] [address] [phone]                  Adrian Prows, MD, [facility] 08/27/2021, 6:49 PM Office: [phone]

## 2021-08-27 NOTE — Progress Notes (Signed)
ICD-10-CM   1. Acute ST elevation myocardial infarction (STEMI) of anterior wall (HCC)  I21.09 nitroGLYCERIN (NITROSTAT) 0.4 MG SL tablet     Meds ordered this encounter  Medications   nitroGLYCERIN (NITROSTAT) 0.4 MG SL tablet    Sig: Place 1 tablet (0.4 mg total) under the tongue every 5 (five) minutes as needed for up to 25 days for chest pain.    Dispense:  25 tablet    Refill:  3

## 2021-08-28 ENCOUNTER — Telehealth: Payer: Self-pay | Admitting: *Deleted

## 2021-08-28 ENCOUNTER — Telehealth: Payer: Self-pay

## 2021-08-28 NOTE — Chronic Care Management (AMB) (Signed)
  Care Coordination  Note  08/28/2021 Name: Leslie Cross MRN: [id number] DOB: 06-17-1950  Leslie Cross is a 71 y.o. year old female who is a primary care patient of Hagler, Apolonio Schneiders, MD. I reached out to [name] by phone today to offer care coordination services.      Ms. Kolker was given information about Care Coordination services today including:  The Care Coordination services include support from the care team which includes your Nurse Coordinator, Clinical Social Worker, or Pharmacist.  The Care Coordination team is here to help remove barriers to the health concerns and goals most important to you. Care Coordination services are voluntary and the patient may decline or stop services at any time by request to their care team member.   Patient agreed to services and verbal consent obtained.   Follow up plan: Telephone appointment with care coordination team member scheduled for:09/01/21  [place]  Direct Dial: [phone]

## 2021-08-28 NOTE — Telephone Encounter (Signed)
Location of hospitalization: [place] Reason for hospitalization: chest pain  Date of discharge: 08/27/2021 Date of first communication with patient: today Person contacting patient: Gaye Alken, CMA Current symptoms: none Do you understand why you were in the Hospital: Yes Questions regarding discharge instructions: None Where were you discharged to: Home Medications reviewed: Yes Allergies reviewed: Yes Dietary changes reviewed: Yes. Discussed low fat and low salt diet.  Referals reviewed: NA Activities of Daily Living: Able to with mild limitations Any transportation issues/concerns: None Any patient concerns: None Confirmed importance & date/time of Follow up appt: Yes Confirmed with patient if condition begins to worsen call. Pt was given the office number and encouraged to call back with questions or concerns: Yes

## 2021-09-01 ENCOUNTER — Ambulatory Visit: Payer: Self-pay

## 2021-09-01 NOTE — Patient Outreach (Signed)
  Care Coordination   Initial Visit Note   09/01/2021 Name: Leslie Cross MRN: [id number] DOB: 09-15-1950  Leslie Cross is a 71 y.o. year old female who sees Leslie Macadam, MD for primary care. I spoke with  Leslie Cross by phone today  What matters to the patients health and wellness today?  TThe patient statged " I am not scheduled to talk with you today. " She did not wish tgo speak with me.   SDOH assessments and interventions completed:   No   Care Coordination Interventions Activated:  No Care Coordination Interventions:   No, not indicated  Follow up plan: No further intervention required.  Encounter Outcome:  Pt. Refused   Leslie Arms RN, BSN, [facility] Care Management Coordinator [facility]   Phone: [phone]

## 2021-09-03 ENCOUNTER — Ambulatory Visit: Payer: Medicare Other | Admitting: Student

## 2021-09-03 ENCOUNTER — Encounter: Payer: Self-pay | Admitting: Student

## 2021-09-03 ENCOUNTER — Inpatient Hospital Stay: Payer: Medicare Other

## 2021-09-03 VITALS — BP 86/49 | HR 68 | Temp 97.9°F | Resp 17 | Ht 62.0 in | Wt 93.2 lb

## 2021-09-03 DIAGNOSIS — I2510 Atherosclerotic heart disease of native coronary artery without angina pectoris: Secondary | ICD-10-CM

## 2021-09-03 DIAGNOSIS — I5020 Unspecified systolic (congestive) heart failure: Secondary | ICD-10-CM

## 2021-09-03 DIAGNOSIS — I252 Old myocardial infarction: Secondary | ICD-10-CM | POA: Diagnosis not present

## 2021-09-03 DIAGNOSIS — R002 Palpitations: Secondary | ICD-10-CM | POA: Diagnosis not present

## 2021-09-03 MED ORDER — METOPROLOL SUCCINATE ER 25 MG PO TB24: 12.5000 mg | ORAL_TABLET | Freq: Every day | ORAL | 2 refills | Status: DC

## 2021-09-03 NOTE — Progress Notes (Signed)
Primary Physician/Referring:  Caren Macadam, MD  Patient ID: Leslie Cross, female    DOB: Sep 11, 1950, 71 y.o.   MRN: [id number]  Chief Complaint  Patient presents with   New Patient (Initial Visit)   Follow-up   Transitions Of Care   HPI:    Leslie Cross  is a 71 y.o. 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  Denies history of hypertension, hyperlipidemia, diabetes, family history of CAD.  Patient presented to [facility] emergency department 08/26/2021 with chest pain as well as nausea and diaphoresis.  EKG at that time revealed anterior STEMI and she was taken emergently to Cath Lab, underwent successful thrombectomy followed by revascularization and stenting to the large mid LAD.  Following PCI patient did have single 3 beat NSVT, however she recovered well and ambulated without symptoms or arrhythmias, she was therefore discharged without a LifeVest.  Prior to discharge patient enrolled in EVOLVE-MI trial (PCSK9 inhibitor).  Echocardiogram did reveal LVEF of 35-40%, patient was therefore discharged with low-dose metoprolol succinate as well as losartan given soft blood pressures.  She now presents for follow-up.  Patient reports compliance with medications.  She is also working hard to quit smoking.  She is currently able to walk/jog with her dog without issue, particularly for short distances.  For work she does stocking (physical labor).  Patient since discharge has had occasional palpitations which she reports have occurred during situational stressors, particularly when dealing with her employer regarding time off.  Denies chest pain, dyspnea, syncope, near syncope.  Past Medical History:  Diagnosis Date   Pulmonary emboli ([place]) 08/29/2018   Recurrent acute deep vein thrombosis (DVT) of  lower extremity ([place]) 12/12/2018   Past Surgical History:  Procedure Laterality Date   ABDOMINAL HYSTERECTOMY     CARDIAC CATHETERIZATION     CORONARY/GRAFT ACUTE MI REVASCULARIZATION N/A 08/26/2021   Procedure: Coronary/Graft Acute MI Revascularization;  Surgeon: Adrian Prows, MD;  Location: [facility];  Service: Cardiovascular;  Laterality: N/A;   LEFT HEART CATH AND CORONARY ANGIOGRAPHY N/A 08/26/2021   Procedure: LEFT HEART CATH AND CORONARY ANGIOGRAPHY;  Surgeon: Adrian Prows, MD;  Location: [facility];  Service: Cardiovascular;  Laterality: N/A;   Family History  Problem Relation Age of Onset   Heart failure Father    Stroke Father 38   Heart attack Sister    Suicidality Sister    Prostate cancer Brother    CAD Neg Hx    Diabetes Mellitus I Neg Hx     Social History   Tobacco Use   Smoking status: Every Day    Packs/day: 0.25    Types: Cigarettes   Smokeless tobacco: Never   Tobacco comments:    Cut back on smoking, 3 cigarettes daily  Substance Use Topics   Alcohol use: No   Marital Status: Single   ROS  Review of Systems  Constitutional: Negative for malaise/fatigue and weight gain.  Cardiovascular:  Positive for palpitations. Negative for chest pain, claudication, dyspnea on exertion, leg swelling, near-syncope, orthopnea, paroxysmal nocturnal dyspnea and syncope.  Neurological:  Negative for dizziness.    Objective  Blood pressure (!) 86/49, pulse 68, temperature 97.9 F (36.6 C), temperature source Temporal, resp. rate 17, height '5\' 2"'$  (1.575 m), weight 93 lb 3.2 oz (42.3 kg), SpO2 98 %.  09/03/2021   12:43 PM 09/03/2021   12:25 PM 08/27/2021    3:44 AM  Vitals with BMI  Height  '5\' 2"'$    Weight  93 lbs 3 oz   BMI  02.77   Systolic 86 86 98  Diastolic 49 49 50  Pulse 68 66 76      Physical Exam Vitals reviewed.  Cardiovascular:     Rate and Rhythm: Normal rate and regular rhythm.     Pulses: Intact distal pulses.          Radial  pulses are 1+ on the right side and 2+ on the left side.     Heart sounds: S1 normal and S2 normal. No murmur heard.    No gallop.     Comments: Radial access site healing well with no evidence of bruit or hematoma.  Mild surrounding ecchymosis. Pulmonary:     Effort: Pulmonary effort is normal. No respiratory distress.     Breath sounds: No wheezing, rhonchi or rales.  Musculoskeletal:     Right lower leg: No edema.     Left lower leg: No edema.  Neurological:     Mental Status: She is alert.    Laboratory examination:   estimated creatinine clearance is 43.7 mL/min (by C-G formula based on SCr of 0.54 mg/dL).     Latest Ref Rng & Units 08/26/2021    6:04 AM 08/26/2021   12:33 AM 08/26/2021   12:27 AM  CMP  Glucose 70 - 99 mg/dL 109  134  142   BUN 8 - 23 mg/dL '7  13  10   '$ Creatinine 0.44 - 1.00 mg/dL 0.54  0.60  0.75   Sodium 135 - 145 mmol/L 140  140  139   Potassium 3.5 - 5.1 mmol/L 3.5  3.8  3.9   Chloride 98 - 111 mmol/L 110  103  107   CO2 22 - 32 mmol/L 23   23   Calcium 8.9 - 10.3 mg/dL 8.6   8.4   Total Protein 6.5 - 8.1 g/dL   5.1   Total Bilirubin 0.3 - 1.2 mg/dL   0.4   Alkaline Phos 38 - 126 U/L   129   AST 15 - 41 U/L   20   ALT 0 - 44 U/L   14       Latest Ref Rng & Units 08/26/2021    6:04 AM 08/26/2021   12:33 AM 08/26/2021   12:27 AM  CBC  WBC 4.0 - 10.5 K/uL 10.5   9.5   Hemoglobin 12.0 - 15.0 g/dL 13.2  12.6  12.7   Hematocrit 36.0 - 46.0 % 37.7  37.0  38.2   Platelets 150 - 400 K/uL 227   217     Lipid Panel Recent Labs    08/26/21 0027  CHOL 147  TRIG 94  LDLCALC 103*  VLDL 19  HDL 25*  CHOLHDL 5.9    HEMOGLOBIN A1C Lab Results  Component Value Date   HGBA1C 5.4 08/26/2021   MPG 108.28 08/26/2021   TSH No results for input(s): "TSH" in the last 8760 hours.  External labs:  None  Allergies   Allergies  Allergen Reactions   Alpha-Gal Anaphylaxis   Aminoglycosides Anaphylaxis   Beef-Derived Products Anaphylaxis    Alpha-gal    Erythromycin Anaphylaxis   Keflex [Cephalexin] Anaphylaxis   Other Anaphylaxis and Other (See Comments)    Drugs ending in "cillin" - Anaphylaxis Drugs ending in "mycin" -  Anaphylaxis     Pork-Derived Products Anaphylaxis    Alpha-gal   Penicillins Other (See Comments)    Did it involve swelling of the face/tongue/throat, SOB, or low BP? Unknown Did it involve sudden or severe rash/hives, skin peeling, or any reaction on the inside of your mouth or nose? Unknown Did you need to seek medical attention at a hospital or doctor's office? Unknown When did it last happen?      10 years If all above answers are "NO", may proceed with cephalosporin use.   Vitamin E Diarrhea    Bloody diarrhea    Medications Prior to Visit:   Outpatient Medications Prior to Visit  Medication Sig Dispense Refill   Ascorbic Acid (VITAMIN C PO) Take 1 tablet by mouth every evening.     aspirin 81 MG chewable tablet Chew 1 tablet (81 mg total) by mouth daily. 30 tablet 11   atorvastatin (LIPITOR) 40 MG tablet Take 1 tablet (40 mg total) by mouth daily. 30 tablet 2   Cholecalciferol (VITAMIN D-3 PO) Take 1 capsule by mouth every evening.     FERROUS SULFATE PO Take 1 tablet by mouth every evening.     losartan (COZAAR) 25 MG tablet Take 1 tablet (25 mg total) by mouth every evening. 30 tablet 2   nitroGLYCERIN (NITROSTAT) 0.4 MG SL tablet Place 1 tablet (0.4 mg total) under the tongue every 5 (five) minutes as needed for up to 25 days for chest pain. 25 tablet 3   Study - EVOLVE-MI - evolocumab (REPATHA) 140 mg/mL SQ injection (PI-[facility]uc[facility]) Inject 1 mL (140 mg total) into the skin every 14 (fourteen) days. For Investigational Use Only. Inject subcutaneously into abdomen, thigh, or upper arm every 14 days. Rotate injection sites and do not inject into areas where skin is tender, bruised, or red. Please contact [facility] for any questions or concerns regarding this medication. 12  mL 0   ticagrelor (BRILINTA) 90 MG TABS tablet Take 1 tablet (90 mg total) by mouth 2 (two) times daily. 60 tablet 0   metoprolol succinate (TOPROL-XL) 25 MG 24 hr tablet Take 1 tablet (25 mg total) by mouth daily. 30 tablet 2   No facility-administered medications prior to visit.   Final Medications at End of Visit    Current Meds  Medication Sig   Ascorbic Acid (VITAMIN C PO) Take 1 tablet by mouth every evening.   aspirin 81 MG chewable tablet Chew 1 tablet (81 mg total) by mouth daily.   atorvastatin (LIPITOR) 40 MG tablet Take 1 tablet (40 mg total) by mouth daily.   Cholecalciferol (VITAMIN D-3 PO) Take 1 capsule by mouth every evening.   FERROUS SULFATE PO Take 1 tablet by mouth every evening.   losartan (COZAAR) 25 MG tablet Take 1 tablet (25 mg total) by mouth every evening.   nitroGLYCERIN (NITROSTAT) 0.4 MG SL tablet Place 1 tablet (0.4 mg total) under the tongue every 5 (five) minutes as needed for up to 25 days for chest pain.   Study - EVOLVE-MI - evolocumab (REPATHA) 140 mg/mL SQ injection (PI-Stuckey) Inject 1 mL (140 mg total) into the skin every 14 (fourteen) days. For Investigational Use Only. Inject subcutaneously into abdomen, thigh, or upper arm every 14 days. Rotate injection sites and do not inject into areas where skin is tender, bruised, or red. Please contact [facility] for any questions or concerns regarding this medication.   ticagrelor (BRILINTA) 90 MG TABS  tablet Take 1 tablet (90 mg total) by mouth 2 (two) times daily.   [DISCONTINUED] metoprolol succinate (TOPROL-XL) 25 MG 24 hr tablet Take 1 tablet (25 mg total) by mouth daily.   Radiology:   No results found.  Cardiac Studies:   Left Heart Catheterization 08/26/21:  LV: Hemodynamics: 107/45, EDP 16 mmHg.  Ao 116/64 mmHg.  There was no pressure gradient across the aortic valve. Mild to moderate decrease in LVEF, 40 to 45% with mid to distal anterior and apical and  apical inferior akinesis. RCA: Very large caliber vessel and a dominant vessel.  Very minimal disease is evident.  Large PDA. LM: Moderate caliber vessel.  No significant disease. LAD: Gives origin to large D1, followed by thrombotic occlusion.  TIMI 0 flow.  There is a small D2 and several small diagonals distally. CX: Proximal circumflex has a 50 to 60% irregular stenosis.  Moderate-sized OM1 with mild disease.   Interventional data: Successful thrombectomy with a Pronto 5.5 with extraction of significant amount of thrombus followed by direct stenting with 2.5 x 28 mm Synergy XT DES at 16 atmospheric pressure, stenosis reduced from 100% to 0% with TIMI 0 to TIMI-3 flow.  115 mL contrast utilized.      Echocardiogram 08/26/2021:  1. Left ventricular ejection fraction, by estimation, is 35 to 40%. Left ventricular ejection fraction by 2D MOD biplane is 41.3 %. The left ventricle has moderately decreased function. The left ventricle demonstrates regional wall motion abnormalities  (see scoring diagram/findings for description). Left ventricular diastolic parameters were normal. There is akinesis of the left ventricular, mid-apical anterior wall, apical segment and septal wall. There is akinesis of the left ventricular, apical  inferior segment.  2. Right ventricular systolic function is normal. The right ventricular size is normal. There is mildly elevated pulmonary artery systolic pressure. The estimated right ventricular systolic pressure is 40.3 mmHg.  3. The mitral valve is normal in structure. Mild mitral valve regurgitation. No evidence of mitral stenosis.  4. The aortic valve is tricuspid. Aortic valve regurgitation is mild.  5. The inferior vena cava is dilated in size with <50% respiratory variability, suggesting right atrial pressure of 15 mmHg.  EKG:   09/03/2021: Sinus rhythm at a rate of 64 bpm. Low voltage complexes. Anterolateral ST-T changes evolving.   08/26/2021: Normal sinus  rhythm at rate of 69 bpm.  Poor R wave progression, cannot exclude anteroseptal infarct old.  No evidence of ischemia.  Low-voltage complexes.  Pulmonary disease pattern.  08/25/2021, anterior ST elevation suggestive of injury pattern not present.  Assessment     ICD-10-CM   1. Coronary artery disease involving native coronary artery of native heart without angina pectoris  I25.10 EKG 12-Lead    LONG TERM MONITOR (3-14 DAYS)    metoprolol succinate (TOPROL-XL) 25 MG 24 hr tablet    PCV ECHOCARDIOGRAM COMPLETE    Lipid Panel With LDL/HDL Ratio    2. History of ST elevation myocardial infarction (STEMI)  I25.2     3. HFrEF (heart failure with reduced ejection fraction) (HCC)  I50.20 LONG TERM MONITOR (3-14 DAYS)    PCV ECHOCARDIOGRAM COMPLETE    4. Palpitations  R00.2 LONG TERM MONITOR (3-14 DAYS)       Medications Discontinued During This Encounter  Medication Reason   metoprolol succinate (TOPROL-XL) 25 MG 24 hr tablet     Meds ordered this encounter  Medications   metoprolol succinate (TOPROL-XL) 25 MG 24 hr tablet    Sig: Take 0.5  tablets (12.5 mg total) by mouth daily.    Dispense:  30 tablet    Refill:  2    Recommendations:   Leslie Cross is a 71 y.o. 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  Denies history of hypertension, hyperlipidemia, diabetes, family history of CAD.  Patient presented to [facility] emergency department 08/26/2021 with chest pain as well as nausea and diaphoresis.  EKG at that time revealed anterior STEMI and she was taken emergently to Cath Lab, underwent successful thrombectomy followed by revascularization and stenting to the large mid LAD.  Following PCI patient did have single 3 beat NSVT, however she recovered well and ambulated without symptoms or  arrhythmias, she was therefore discharged without a LifeVest.  Prior to discharge patient enrolled in EVOLVE-MI trial (PCSK9 inhibitor).  Echocardiogram did reveal LVEF of 35-40%, patient was therefore discharged with low-dose metoprolol succinate as well as losartan given soft blood pressures.  She now presents for follow-up.  Given the patient has continued to have occasional episodes of palpitations, although suspect this is related to stress, will obtain 1 week cardiac monitor to evaluate for recurrence of NSVT.  Patient's blood pressure remains quite soft, will therefore reduce metoprolol succinate from 25 mg to 12.5 mg p.o. daily.  Patient is medical regimen without issue including dual antiplatelet therapy.  She is now on both statin and PCSK9 inhibitor, will repeat lipid profile testing in 8 weeks.  Given reduced LVEF suspect this may improve following recovery from STEMI, will therefore repeat echocardiogram in 8 weeks to reevaluate LVEF.  Follow-up in 3 months, sooner if needed.   Alethia Berthold, PA-C 09/03/2021, 2:53 PM Office: [phone]

## 2021-09-10 ENCOUNTER — Ambulatory Visit: Payer: Medicare Other

## 2021-09-10 DIAGNOSIS — I2510 Atherosclerotic heart disease of native coronary artery without angina pectoris: Secondary | ICD-10-CM

## 2021-09-10 DIAGNOSIS — I5020 Unspecified systolic (congestive) heart failure: Secondary | ICD-10-CM

## 2021-09-16 DIAGNOSIS — R002 Palpitations: Secondary | ICD-10-CM | POA: Diagnosis not present

## 2021-09-16 DIAGNOSIS — I2510 Atherosclerotic heart disease of native coronary artery without angina pectoris: Secondary | ICD-10-CM | POA: Diagnosis not present

## 2021-09-16 DIAGNOSIS — I5020 Unspecified systolic (congestive) heart failure: Secondary | ICD-10-CM | POA: Diagnosis not present

## 2021-09-18 ENCOUNTER — Telehealth: Payer: Self-pay | Admitting: Cardiology

## 2021-09-18 NOTE — Telephone Encounter (Signed)
ON-CALL CARDIOLOGY 09/18/21  Patient's name: Leslie Cross.   MRN: [id number].    DOB: November 08, 1950 Primary care provider: Caren Macadam, MD.  Interaction regarding this patient's care today: Wanted to review the results of the echo. She is informed her LVEF has improved compared to July 2023.   She has been having trouble sleeping and think its due to medication side effect. Reviewed the meds to obvious culprit.   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 I was informed her the Dr.Ganji will reach out to her regarding this and she is agreeable.   [name], [place], [name]  Pager: [phone] Office: [phone]

## 2021-09-22 ENCOUNTER — Emergency Department (HOSPITAL_COMMUNITY): Payer: Medicare Other

## 2021-09-22 ENCOUNTER — Emergency Department (HOSPITAL_COMMUNITY)
Admission: EM | Admit: 2021-09-22 | Discharge: 2021-09-22 | Disposition: A | Discharge status: Home / Self Care | Payer: Medicare Other | Attending: Emergency Medicine | Admitting: Emergency Medicine

## 2021-09-22 ENCOUNTER — Encounter (HOSPITAL_COMMUNITY): Payer: Self-pay | Admitting: Emergency Medicine

## 2021-09-22 ENCOUNTER — Other Ambulatory Visit: Payer: Self-pay

## 2021-09-22 DIAGNOSIS — Z79899 Other long term (current) drug therapy: Secondary | ICD-10-CM | POA: Diagnosis not present

## 2021-09-22 DIAGNOSIS — Z7982 Long term (current) use of aspirin: Secondary | ICD-10-CM | POA: Diagnosis not present

## 2021-09-22 DIAGNOSIS — R6889 Other general symptoms and signs: Secondary | ICD-10-CM | POA: Diagnosis not present

## 2021-09-22 DIAGNOSIS — R0602 Shortness of breath: Secondary | ICD-10-CM | POA: Insufficient documentation

## 2021-09-22 DIAGNOSIS — R079 Chest pain, unspecified: Secondary | ICD-10-CM | POA: Diagnosis not present

## 2021-09-22 DIAGNOSIS — Z743 Need for continuous supervision: Secondary | ICD-10-CM | POA: Diagnosis not present

## 2021-09-22 DIAGNOSIS — I2510 Atherosclerotic heart disease of native coronary artery without angina pectoris: Secondary | ICD-10-CM | POA: Diagnosis not present

## 2021-09-22 DIAGNOSIS — I10 Essential (primary) hypertension: Secondary | ICD-10-CM | POA: Diagnosis not present

## 2021-09-22 LAB — COMPREHENSIVE METABOLIC PANEL
ALT: 20 U/L (ref 0–44)
AST: 24 U/L (ref 15–41)
Albumin: 3.6 g/dL (ref 3.5–5.0)
Alkaline Phosphatase: 159 U/L — ABNORMAL HIGH (ref 38–126)
Anion gap: 8 (ref 5–15)
BUN: <5 mg/dL — ABNORMAL LOW (ref 8–23)
CO2: 24 mmol/L (ref 22–32)
Calcium: 9.4 mg/dL (ref 8.9–10.3)
Chloride: 106 mmol/L (ref 98–111)
Creatinine, Ser: 0.84 mg/dL (ref 0.44–1.00)
GFR, Estimated: >60 mL/min (ref >60)
Glucose, Bld: 100 mg/dL — ABNORMAL HIGH (ref 70–99)
Potassium: 4.7 mmol/L (ref 3.5–5.1)
Sodium: 138 mmol/L (ref 135–145)
Total Bilirubin: 0.8 mg/dL (ref 0.3–1.2)
Total Protein: 5.9 g/dL — ABNORMAL LOW (ref 6.5–8.1)

## 2021-09-22 LAB — CBC WITH DIFFERENTIAL/PLATELET
Abs Immature Granulocytes: 0.04 10*3/uL (ref 0.00–0.07)
Basophils Absolute: 0.1 10*3/uL (ref 0.0–0.1)
Basophils Relative: 1 %
Eosinophils Absolute: 0.1 10*3/uL (ref 0.0–0.5)
Eosinophils Relative: 1 %
HCT: 41.5 % (ref 36.0–46.0)
Hemoglobin: 14.2 g/dL (ref 12.0–15.0)
Immature Granulocytes: 1 %
Lymphocytes Relative: 25 %
Lymphs Abs: 2 10*3/uL (ref 0.7–4.0)
MCH: 33.1 pg (ref 26.0–34.0)
MCHC: 34.2 g/dL (ref 30.0–36.0)
MCV: 96.7 fL (ref 80.0–100.0)
Monocytes Absolute: 0.6 10*3/uL (ref 0.1–1.0)
Monocytes Relative: 8 %
Neutro Abs: 5.1 10*3/uL (ref 1.7–7.7)
Neutrophils Relative %: 64 %
Platelets: 266 10*3/uL (ref 150–400)
RBC: 4.29 MIL/uL (ref 3.87–5.11)
RDW: 12 % (ref 11.5–15.5)
WBC: 7.9 10*3/uL (ref 4.0–10.5)
nRBC: 0 % (ref 0.0–0.2)

## 2021-09-22 LAB — TROPONIN I (HIGH SENSITIVITY)
Troponin I (High Sensitivity): 8 ng/L (ref <18)
Troponin I (High Sensitivity): 8 ng/L (ref <18)

## 2021-09-22 LAB — LIPASE, BLOOD: Lipase: 35 U/L (ref 11–51)

## 2021-09-22 LAB — BRAIN NATRIURETIC PEPTIDE: B Natriuretic Peptide: 98.2 pg/mL (ref 0.0–100.0)

## 2021-09-22 MED ORDER — IOHEXOL 350 MG/ML SOLN
50.0000 mL | Freq: Once | INTRAVENOUS | Status: AC | PRN
  Administered 2021-09-22: 50 mL via INTRAVENOUS

## 2021-09-22 MED ORDER — SODIUM CHLORIDE 0.9 % IV BOLUS
500.0000 mL | Freq: Once | INTRAVENOUS | Status: AC
  Administered 2021-09-22: 500 mL via INTRAVENOUS

## 2021-09-22 NOTE — Discharge Instructions (Signed)
Your history, exam and workup were overall reassuring.  Your heart enzymes were negative both times they were checked.  There is no evidence of blood clot on your CT scan.  Please follow-up with cardiology as they recommended.  If any symptoms change or worsen acutely, please return to the nearest emergency department.

## 2021-09-22 NOTE — ED Provider Notes (Signed)
[facility] EMERGENCY DEPARTMENT Provider Note   CSN: [id number] Arrival date & time: 09/22/21  1108     History  Chief Complaint  Patient presents with   Shortness of Breath    Leslie Cross is a 71 y.o. female.  The history is provided by the patient and medical records. No language interpreter was used.  Shortness of Breath Severity:  Moderate Onset quality:  Gradual Duration:  2 days Timing:  Intermittent Progression:  Waxing and waning Chronicity:  New Context: not URI   Relieved by:  Nothing Worsened by:  Deep breathing Ineffective treatments:  None tried Associated symptoms: no abdominal pain, no chest pain (tightness reported), no cough, no diaphoresis, no fever, no headaches, no neck pain, no sputum production, no vomiting and no wheezing   Risk factors: hx of PE/DVT and recent surgery        Home Medications Prior to Admission medications   Medication Sig Start Date End Date Taking? Authorizing Provider  Ascorbic Acid (VITAMIN C PO) Take 1 tablet by mouth every evening.    [provider]  aspirin 81 MG chewable tablet Chew 1 tablet (81 mg total) by mouth daily. 08/28/21   Adrian Prows, MD  atorvastatin (LIPITOR) 40 MG tablet Take 1 tablet (40 mg total) by mouth daily. 08/28/21   Adrian Prows, MD  Cholecalciferol (VITAMIN D-3 PO) Take 1 capsule by mouth every evening.    [provider]  FERROUS SULFATE PO Take 1 tablet by mouth every evening.    [provider]  losartan (COZAAR) 25 MG tablet Take 1 tablet (25 mg total) by mouth every evening. 08/27/21   Adrian Prows, MD  metoprolol succinate (TOPROL-XL) 25 MG 24 hr tablet Take 0.5 tablets (12.5 mg total) by mouth daily. 09/03/21   Cantwell, Celeste C, PA-C  nitroGLYCERIN (NITROSTAT) 0.4 MG SL tablet Place 1 tablet (0.4 mg total) under the tongue every 5 (five) minutes as needed for up to 25 days for chest pain. 08/27/21 09/21/21  Adrian Prows, MD  Study - EVOLVE-MI - evolocumab  (REPATHA) 140 mg/mL SQ injection (PI-Stuckey) Inject 1 mL (140 mg total) into the skin every 14 (fourteen) days. For Investigational Use Only. Inject subcutaneously into abdomen, thigh, or upper arm every 14 days. Rotate injection sites and do not inject into areas where skin is tender, bruised, or red. Please contact [facility] for any questions or concerns regarding this medication. 08/27/21   Adrian Prows, MD  ticagrelor (BRILINTA) 90 MG TABS tablet Take 1 tablet (90 mg total) by mouth 2 (two) times daily. 08/27/21   Adrian Prows, MD      Allergies    Alpha-gal, Aminoglycosides, Beef-derived products, Erythromycin, Keflex [cephalexin], Other, Pork-derived products, Penicillins, and Vitamin e    Review of Systems   Review of Systems  Constitutional:  Negative for chills, diaphoresis, fatigue and fever.  HENT:  Negative for congestion.   Respiratory:  Positive for chest tightness and shortness of breath. Negative for cough, sputum production and wheezing.   Cardiovascular:  Negative for chest pain (tightness reported), palpitations and leg swelling.  Gastrointestinal:  Negative for abdominal pain, constipation, diarrhea, nausea and vomiting.  Genitourinary:  Negative for flank pain.  Musculoskeletal:  Negative for back pain, neck pain and neck stiffness.  Skin:  Negative for wound.  Neurological:  Negative for dizziness, light-headedness and headaches.  Psychiatric/Behavioral:  Negative for agitation and confusion.   All other systems reviewed and are negative.  Physical Exam Updated Vital Signs BP (!) 115/55   Pulse 77   Temp 97.8 F (36.6 C) (Oral)   Resp (!) 23   SpO2 100%  Physical Exam Vitals and nursing note reviewed.  Constitutional:      General: She is not in acute distress.    Appearance: She is well-developed. She is not ill-appearing, toxic-appearing or diaphoretic.  HENT:     Head: Normocephalic and atraumatic.     Mouth/Throat:      Mouth: Mucous membranes are moist.  Eyes:     Conjunctiva/sclera: Conjunctivae normal.     Pupils: Pupils are equal, round, and reactive to light.  Cardiovascular:     Rate and Rhythm: Normal rate and regular rhythm.     Heart sounds: No murmur heard. Pulmonary:     Effort: Pulmonary effort is normal. No tachypnea or respiratory distress.     Breath sounds: Normal breath sounds. No wheezing, rhonchi or rales.  Chest:     Chest wall: No tenderness.  Abdominal:     Palpations: Abdomen is soft.     Tenderness: There is no abdominal tenderness.  Musculoskeletal:        General: No swelling.     Cervical back: Neck supple.     Right lower leg: No tenderness. No edema.     Left lower leg: No tenderness. No edema.  Skin:    General: Skin is warm and dry.     Capillary Refill: Capillary refill takes less than 2 seconds.     Findings: No erythema.  Neurological:     General: No focal deficit present.     Mental Status: She is alert.  Psychiatric:        Mood and Affect: Mood normal.     ED Results / Procedures / Treatments   Labs (all labs ordered are listed, but only abnormal results are displayed) Labs Reviewed  COMPREHENSIVE METABOLIC PANEL - Abnormal; Notable for the following components:      Result Value   Glucose, Bld 100 (*)    BUN <5 (*)    Total Protein 5.9 (*)    Alkaline Phosphatase 159 (*)    All other components within normal limits  BRAIN NATRIURETIC PEPTIDE  CBC WITH DIFFERENTIAL/PLATELET  LIPASE, BLOOD  TROPONIN I (HIGH SENSITIVITY)  TROPONIN I (HIGH SENSITIVITY)    EKG EKG Interpretation  Date/Time:  Tuesday September 22 2021 11:24:22 EDT Ventricular Rate:  75 PR Interval:  134 QRS Duration: 105 QT Interval:  375 QTC Calculation: 419 R Axis:   26 Text Interpretation: Sinus rhythm Anterior infarct, age indeterminate when compared to prior, new t wave inversion in lead V3-V5. No STEMI Confirmed by Antony Blackbird [phone]) on 09/22/2021 11:40:37  AM  Radiology CT Angio Chest PE W and/or Wo Contrast  Result Date: 09/22/2021 CLINICAL DATA:  Chest pain, shortness of breath, clinical suspicion for pulmonary embolus EXAM: CT ANGIOGRAPHY CHEST WITH CONTRAST TECHNIQUE: Multidetector CT imaging of the chest was performed using the standard protocol during bolus administration of intravenous contrast. Multiplanar CT image reconstructions and MIPs were obtained to evaluate the vascular anatomy. RADIATION DOSE REDUCTION: This exam was performed according to the departmental dose-optimization program which includes automated exposure control, adjustment of the mA and/or kV according to patient size and/or use of iterative reconstruction technique. CONTRAST:  50m OMNIPAQUE IOHEXOL 350 MG/ML SOLN COMPARISON:  Previous studies including chest radiograph done earlier today and CT chest done on 08/25/2018 FINDINGS: Cardiovascular: Contrast density in thoracic aorta  is less than optimal the evaluate the lumen. As far as seen, there is no demonstrable intimal flap in the thoracic aorta. There is ectasia of the ascending thoracic aorta measuring 3.3 cm. There are no intraluminal filling defects in pulmonary artery branches. Scattered coronary artery calcifications are seen. Mediastinum/Nodes: No significant lymphadenopathy is seen. Thyroid is enlarged, more so in the posterior right lower lobe which is extending into the superior mediastinum. Lungs/Pleura: There is no focal pulmonary consolidation. Small linear patchy densities are seen in the periphery of both lungs suggesting scarring or subsegmental atelectasis. There is no pleural effusion or pneumothorax. Upper Abdomen: Unremarkable. Musculoskeletal: No acute findings are seen. Review of the MIP images confirms the above findings. IMPRESSION: There is no evidence of pulmonary artery embolism. There is no focal pulmonary consolidation. There is no pleural effusion or pneumothorax. Scattered linear densities in both  lungs suggest scarring or subsegmental atelectasis. Coronary artery calcifications are seen. 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 Electronically Signed   By: Elmer Picker M.D.   On: 09/22/2021 12:29   DG Chest Portable 1 View  Result Date: 09/22/2021 CLINICAL DATA:  History of DVT and pulmonary embolism. Recent STEMI with PCI. New shortness of breath over the last 2 days. EXAM: PORTABLE CHEST 1 VIEW COMPARISON:  AP chest 08/26/2021 and 08/25/2018; CTA chest 08/25/2018 FINDINGS: Cardiac silhouette and mediastinal contours are within normal limits. There is again flattening of the diaphragms and moderate hyperinflation. Moderate biapical pleural thickening/scarring is unchanged. Chronically stable scarring within the lateral left mid lung. No new airspace opacity. No pleural effusion or pneumothorax. Mild-to-moderate multilevel degenerative disc changes of the thoracic spine. IMPRESSION: 1. No significant change from prior. Left midlung lateral chronic scarring. 2. No acute lung process. Electronically Signed   By: Yvonne Kendall M.D.   On: 09/22/2021 11:52    Procedures Procedures    Medications Ordered in ED Medications  iohexol (OMNIPAQUE) 350 MG/ML injection 50 mL (50 mLs Intravenous Contrast Given 09/22/21 1215)  sodium chloride 0.9 % bolus 500 mL (0 mLs Intravenous Stopped 09/22/21 1542)    ED Course/ Medical Decision Making/ A&P                           Medical Decision Making Amount and/or Complexity of Data Reviewed Labs: ordered. Radiology: ordered.  Risk Prescription drug management.    IZEL HOCHBERG is a 71 y.o. female with a past medical history significant for previous DVT and PE, recent STEMI last month status post PCI, and previous NSVT who presents with 2 days of shortness of breath.  According to patient, for the last 2 days she has had chest tightness and shortness of breath that  has been worsening.  She reports she feels like she cannot take a deep breath.  She reports that she does not want to stay she is having pain but reports there is some discomfort across her chest.  She reports no fevers, chills, cough, or malaise.  She denies any nausea, vomiting, constipation, or diarrhea.  Denies any leg pain or leg swelling.  Reports she does not take any blood thinner medications.  She says she is done very well since her STEMI and procedure last month.  She reports her blood pressure is always low and is very normal for her to be in the 41P and 37T systolic.   On exam, lungs were clear and chest was  nontender.  Abdomen was nontender.  She does have good pulses in extremities and legs are nontender and nonedematous.  Patient is resting comfortably with blood pressure 89 systolic.  Her nursing report from EMS, was initially hypoxic with sats in the 80s but then placed on 4 L and now she is maintaining oxygen saturations on room air and is not requiring further oxygen.  She says that her shortness of breath waxes and wanes along with the tightness.  EKG does not show STEMI but does show some new T wave inversions.  Clinically I am concerned about the short of breath and chest tightness since her recent procedure.  Given her history of pulmonary embolism and this pleuritic tightness and shortness of breath, we will get CT PE study and labs.  Will get delta troponin.  Due to the EKG changes, when workup is complete anticipate discussion with cardiology to discuss disposition.  CT PE study does not show pulmonary embolism.  Initial troponin negative.  Other labs similar to prior and reassuring.   Cardiology came and saw the patient and given her negative delta troponin they told her she would be safe for discharge home to follow-up next week.           Final Clinical Impression(s) / ED Diagnoses Final diagnoses:  SOB (shortness of breath)    Rx / DC Orders ED Discharge  Orders     None      Clinical Impression: 1. SOB (shortness of breath)     Disposition: Discharge  Condition: Good  I have discussed the results, Dx and Tx plan with the pt(& family if present). He/she/they expressed understanding and agree(s) with the plan. Discharge instructions discussed at great length. Strict return precautions discussed and pt &/or family have verbalized understanding of the instructions. No further questions at time of discharge.    Discharge Medication List as of 09/22/2021  3:34 PM      Follow Up: Floydene Flock, [facility] [place] [address] [phone]  Follow up on 09/25/2021 2:00 PM  [facility]       Shevette Bess, Gwenyth Allegra, MD 09/22/21 [phone]

## 2021-09-22 NOTE — ED Triage Notes (Signed)
Patient BIB [facility] from home w/ complaint of SHOB that started yesterday. Pt said everything was relatively normal, she was working pretty strenuously yesterday w/ no issue. Last night before going to bed she felt like she couldn't catch her breath. While this was happening patient felt like her chest was tight. Pt w/ significant cardiac hx w/ stents placed last week in her L main. Pt was hypoxic initially on RA at 88% for EMS but placed on 4 L and there was improvement to 100%. Currently patient is on RA and satting 100%. Aox4.

## 2021-09-22 NOTE — Consult Note (Signed)
CARDIOLOGY CONSULT NOTE  Patient ID: Leslie Cross MRN: [id number] DOB/AGE: [birth year] 71 y.o.  Admit date: 09/22/2021 Referring Physician: [facility] Reason for Consultation:  Shortness of breath  HPI:  71 year old Caucasian female with hypertension, hyperlipidemia, type 2 diabetes mellitus, h/o STEMI treated with primary PCI to LAD 08/26/2021, mild reduced EF, h/o unprovoked DVT/PE, smoker, presented with shortness of breath  Patient has been doing fairly well since her MI without any complaints of chest pain or shortness of breath.  For last 2 days, she has had random episodes of shortness of breath, unrelated to exertion.  She denies any specific complaints of chest pain to me.  She admits to not drinking a lot of water throughout the day.  In the ER, her blood pressure was SBP 80s, improved to 120s with IV fluids.  EKG showed anterior T wave inversion, normal high-sensitivity troponin and BNP.  At this point, patient is completely asymptomatic without any chest pain or shortness of breath.  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  She was on Xarelto, but came off as she "did not tolerate it".  Fortunately, CT angiogram chest today does not show any PE.  On a separate note, apparently patient has had long history of melena.  She had a colonoscopy a year ago and was recommended repeat colonoscopy in 1 year.  However, she canceled this appointment after her MI.  Also, patient complains of generalized itchiness without a rash, hives etc.   Past Medical History:  Diagnosis Date   Pulmonary emboli ([place]) 08/29/2018   Recurrent acute deep vein thrombosis (DVT) of lower extremity ([place]) 12/12/2018     Past Surgical History:  Procedure Laterality Date   ABDOMINAL HYSTERECTOMY     CARDIAC CATHETERIZATION     CORONARY/GRAFT ACUTE MI REVASCULARIZATION N/A 08/26/2021   Procedure: Coronary/Graft Acute MI Revascularization;  Surgeon: Adrian Prows, MD;  Location: [place] [facility];  Service: Cardiovascular;  Laterality: N/A;   LEFT HEART CATH AND CORONARY ANGIOGRAPHY N/A 08/26/2021   Procedure: LEFT HEART CATH AND CORONARY ANGIOGRAPHY;  Surgeon: Adrian Prows, MD;  Location: [place] [facility];  Service: Cardiovascular;  Laterality: N/A;      Family History  Problem Relation Age of Onset   Heart failure Father    Stroke Father 37   Heart attack Sister    Suicidality Sister    Prostate cancer Brother    CAD Neg Hx    Diabetes Mellitus I Neg Hx      Social History: Social History   Socioeconomic History   Marital status: Single    Spouse name: Not on file   Number of children: 3   Years of education: Not on file   Highest education level: Not on file  Occupational History   Not on file  Tobacco Use   Smoking status: Every Day    Packs/day: 0.25    Types: Cigarettes   Smokeless tobacco: Never   Tobacco comments:    Cut back on smoking, 3 cigarettes daily  Vaping Use   Vaping Use: Never used  Substance and Sexual Activity   Alcohol use: No   Drug use: No   Sexual activity: Yes  Other Topics Concern   Not on file  Social History Narrative   1 child passed from cancer   1 child living with cancer   Social Determinants of Health   Financial Resource Strain: Unknown (08/25/2018)   Overall Financial Resource Strain (CARDIA)  Difficulty of Paying Living Expenses: Patient refused  Food Insecurity: Unknown (08/25/2018)   Hunger Vital Sign    Worried About Running Out of Food in the Last Year: Patient refused    [place] in the Last Year: Patient refused  Transportation Needs: Unknown (08/25/2018)   PRAPARE - Hydrologist (Medical): Patient refused    Lack of Transportation (Non-Medical): Patient refused  Physical Activity: Unknown (08/25/2018)   Exercise Vital Sign    Days of Exercise per Week: Patient refused    Minutes of Exercise per Session: Patient refused  Stress: Not on file  Social Connections:  Unknown (08/25/2018)   Social Connection and Isolation Panel [NHANES]    Frequency of Communication with Friends and Family: Patient refused    Frequency of Social Gatherings with Friends and Family: Patient refused    Attends Religious Services: Patient refused    Active Member of Clubs or Organizations: Patient refused    Attends Archivist Meetings: Patient refused    Marital Status: Patient refused  Intimate Partner Violence: Unknown (08/25/2018)   Humiliation, Afraid, Rape, and Kick questionnaire    Fear of Current or Ex-Partner: Patient refused    Emotionally Abused: Patient refused    Physically Abused: Patient refused    Sexually Abused: Patient refused     (Not in a hospital admission)   Review of Systems  Cardiovascular:  Negative for chest pain, dyspnea on exertion, leg swelling, palpitations and syncope.  Respiratory:  Positive for shortness of breath (Now resolved).       Physical Exam: Physical Exam Vitals and nursing note reviewed.  Constitutional:      General: She is not in acute distress. Neck:     Vascular: No JVD.  Cardiovascular:     Rate and Rhythm: Normal rate and regular rhythm.     Heart sounds: Normal heart sounds. No murmur heard. Pulmonary:     Effort: Pulmonary effort is normal.     Breath sounds: Normal breath sounds. No wheezing or rales.  Musculoskeletal:     Right lower leg: No edema.     Left lower leg: No edema.        Imaging/tests reviewed and independently interpreted: Lab Results: CBC, BMP, BNP, trop HS  Cardiac Studies:  Telemetry 09/22/2021: No significant arrhythmia  EKG 09/22/2021: Sinus rhythm 75 bpm Anterior infarct, age indeterminate.   No other acute ischemic changes.  Echocardiogram 08/11/2021: Normal LV systolic function with visual EF 60-65%. Left ventricle cavity  is normal in size. Normal left ventricular wall thickness. Normal global  wall motion. Normal diastolic filling pattern, normal LAP.   Structurally normal trileaflet aortic valve.  Mild (Grade I) aortic  regurgitation.  Structurally normal mitral valve.  Mild to moderate mitral regurgitation.  Structurally normal tricuspid valve.  Mild tricuspid regurgitation.  IVC is dilated with respiratory variation.   Left Heart Catheterization 08/26/2021:  LV: Hemodynamics: 107/45, EDP 16 mmHg.  Ao 116/64 mmHg.  There was no pressure gradient across the aortic valve. Mild to moderate decrease in LVEF, 40 to 45% with mid to distal anterior and apical and apical inferior akinesis. RCA: Very large caliber vessel and a dominant vessel.  Very minimal disease is evident.  Large PDA. LM: Moderate caliber vessel.  No significant disease. LAD: Gives origin to large D1, followed by thrombotic occlusion.  TIMI 0 flow.  There is a small D2 and several small diagonals distally. CX: Proximal circumflex has a 50 to  60% irregular stenosis.  Moderate-sized OM1 with mild disease.  Interventional data: Successful thrombectomy with a Pronto 5.5 with extraction of significant amount of thrombus followed by direct stenting with 2.5 x 28 mm Synergy XT DES at 16 atmospheric pressure, stenosis reduced from 100% to 0% with TIMI 0 to TIMI-3 flow.  115 mL contrast utilized.   Assessment & Recommendations:  70 year old Caucasian female with hypertension, hyperlipidemia, type 2 diabetes mellitus, h/o STEMI treated with primary PCI to LAD 08/26/2021, mild reduced EF, h/o unprovoked DVT/PE, smoker, presented with shortness of breath  Shortness of breath: No objective evidence of hypoxia at this time.  CT chest without any acute lung findings.  No ACS. It is possible that her shortness of breath could be subjective dyspnea due to Brilinta.  Apparently she takes parenteral Pepsi.  It is rather unusual that she did not have the symptoms for 2-1/2 weeks after starting Brilinta.  I encouraged her to continue taking with caffeine.  If symptoms are persistent, could switch  Brilinta to Effient outpatient.  I encouraged her to talk with her PCP regarding long-term management of her unprovoked PE.  Fortunately, no PE seen on CTA today.  CAD: S/p primary PCI to LAD for anterior STEMI 08/2021. Apparently she has had longstanding history of melena.  She was in the need of colonoscopy this year.  If not urgent, recommend delaying it till a year from now.  Hypertension: Brief, likely related to dehydration while on metoprolol and losartan.  Encouraged her to increase fluid intake. She is not in acute heart failure.  EF is around 45 to 50%.  I do not think she will need escalation of therapy for heart failure as such.     Discussed interpretation of tests and management recommendations with the primary team     Nigel Mormon, MD Pager: [phone] Office: [phone]

## 2021-09-24 ENCOUNTER — Other Ambulatory Visit: Payer: Self-pay | Admitting: Cardiology

## 2021-09-24 ENCOUNTER — Other Ambulatory Visit (HOSPITAL_COMMUNITY): Payer: Self-pay

## 2021-09-24 MED ORDER — TICAGRELOR 90 MG PO TABS
90.0000 mg | ORAL_TABLET | Freq: Two times a day (BID) | ORAL | 0 refills | Status: DC
  Filled 2021-09-24: qty 60, 30d supply, fill #0

## 2021-09-25 ENCOUNTER — Other Ambulatory Visit (HOSPITAL_COMMUNITY): Payer: Self-pay

## 2021-09-25 ENCOUNTER — Encounter: Payer: Self-pay | Admitting: Internal Medicine

## 2021-09-25 ENCOUNTER — Ambulatory Visit: Payer: Medicare Other | Admitting: Internal Medicine

## 2021-09-25 VITALS — BP 103/55 | HR 76 | Temp 98.0°F | Resp 16 | Ht 62.0 in | Wt 95.0 lb

## 2021-09-25 DIAGNOSIS — I2109 ST elevation (STEMI) myocardial infarction involving other coronary artery of anterior wall: Secondary | ICD-10-CM | POA: Insufficient documentation

## 2021-09-25 DIAGNOSIS — E785 Hyperlipidemia, unspecified: Secondary | ICD-10-CM

## 2021-09-25 DIAGNOSIS — I951 Orthostatic hypotension: Secondary | ICD-10-CM | POA: Diagnosis not present

## 2021-09-25 HISTORY — DX: Hyperlipidemia, unspecified: E78.5

## 2021-09-25 HISTORY — DX: ST elevation (STEMI) myocardial infarction involving other coronary artery of anterior wall: I21.09

## 2021-09-25 NOTE — Progress Notes (Signed)
Primary Physician/Referring:  Caren Macadam, MD  Patient ID: Leslie Cross, female    DOB: [birth year], 71 y.o.   MRN: [id number]  Chief Complaint  Patient presents with  . Shortness of Breath  . Follow-up  . Coronary Artery Disease        HPI:    Leslie Cross  is a 71 y.o. female who recently had an acute MI in July.  She did have a left heart catheterization with stent placement by Dr. Nadyne Coombes.  Patient presents today feeling very lightheaded especially when she changes position.  She has never had high blood pressure so losartan and metoprolol are making her feel orthostatic.  Discussed with Dr. Nadyne Coombes, okay to discontinue losartan.  Patient should continue statin, aspirin, Brilinta, and Toprol.  Otherwise patient denies chest pain, shortness of breath, palpitations, diaphoresis, syncope.  Past Medical History:  Diagnosis Date  . Pulmonary emboli ([place]) 08/29/2018  . Recurrent acute deep vein thrombosis (DVT) of lower extremity ([place]) 12/12/2018   Past Surgical History:  Procedure Laterality Date  . ABDOMINAL HYSTERECTOMY    . CARDIAC CATHETERIZATION    . CORONARY/GRAFT ACUTE MI REVASCULARIZATION N/A 08/26/2021   Procedure: Coronary/Graft Acute MI Revascularization;  Surgeon: Adrian Prows, MD;  Location: [facility];  Service: Cardiovascular;  Laterality: N/A;  . LEFT HEART CATH AND CORONARY ANGIOGRAPHY N/A 08/26/2021   Procedure: LEFT HEART CATH AND CORONARY ANGIOGRAPHY;  Surgeon: Adrian Prows, MD;  Location: [facility];  Service: Cardiovascular;  Laterality: N/A;   Family History  Problem Relation Age of Onset  . Heart failure Father   . Stroke Father 84  . Heart attack Sister   . Suicidality Sister   . Prostate cancer Brother   . CAD Neg Hx   . Diabetes Mellitus I Neg Hx     Social History   Tobacco Use  . Smoking status: Every Day    Packs/day: 0.25    Types: Cigarettes  . Smokeless tobacco: Never  . Tobacco comments:    Cut back on smoking, 3  cigarettes daily  Substance Use Topics  . Alcohol use: No   Marital Status: Single  ROS  Review of Systems  Cardiovascular:  Positive for near-syncope.  Neurological:  Positive for light-headedness.  All other systems reviewed and are negative. Objective  Blood pressure (!) 103/55, pulse 76, temperature 98 F (36.7 C), resp. rate 16, height '5\' 2"'$  (1.575 m), weight 95 lb (43.1 kg), SpO2 97 %. Body mass index is 17.38 kg/m.     09/25/2021    1:56 PM 09/22/2021    2:30 PM 09/22/2021    2:15 PM  Vitals with BMI  Height '5\' 2"'$     Weight 95 lbs    BMI 58.85    Systolic 027 [id number] 287  Diastolic 55 55 56  Pulse 76 77 72     Physical Exam Vitals reviewed.  Constitutional:      Appearance: She is well-developed.  HENT:     Head: Normocephalic and atraumatic.  Cardiovascular:     Rate and Rhythm: Normal rate and regular rhythm.  Pulmonary:     Effort: Pulmonary effort is normal.     Breath sounds: Normal breath sounds.  Abdominal:     General: Bowel sounds are normal.     Palpations: Abdomen is soft.  Musculoskeletal:     Right lower leg: No edema.     Left lower leg: No edema.  Skin:    General: Skin  is warm and dry.  Neurological:     Mental Status: She is alert.   Medications and allergies   Allergies  Allergen Reactions  . Alpha-Gal Anaphylaxis  . Aminoglycosides Anaphylaxis  . Beef-Derived Products Anaphylaxis    Alpha-gal  . Erythromycin Anaphylaxis  . Keflex [Cephalexin] Anaphylaxis  . Other Anaphylaxis and Other (See Comments)    Drugs ending in "cillin" - Anaphylaxis Drugs ending in "mycin" - Anaphylaxis    . Pork-Derived Products Anaphylaxis    Alpha-gal  . Penicillins Other (See Comments)    Did it involve swelling of the face/tongue/throat, SOB, or low BP? Unknown Did it involve sudden or severe rash/hives, skin peeling, or any reaction on the inside of your mouth or nose? Unknown Did you need to seek medical attention at a hospital or doctor's office?  Unknown When did it last happen?      10 years If all above answers are "NO", may proceed with cephalosporin use.  . Vitamin E Diarrhea    Bloody diarrhea     Medication list after today's encounter   Current Outpatient Medications:  .  Ascorbic Acid (VITAMIN C PO), Take 1 tablet by mouth every evening., Disp: , Rfl:  .  aspirin 81 MG chewable tablet, Chew 1 tablet (81 mg total) by mouth daily., Disp: 30 tablet, Rfl: 11 .  atorvastatin (LIPITOR) 40 MG tablet, Take 1 tablet (40 mg total) by mouth daily., Disp: 30 tablet, Rfl: 2 .  Cholecalciferol (VITAMIN D-3 PO), Take 1 capsule by mouth every evening., Disp: , Rfl:  .  FERROUS SULFATE PO, Take 1 tablet by mouth every evening., Disp: , Rfl:  .  metoprolol succinate (TOPROL-XL) 25 MG 24 hr tablet, Take 0.5 tablets (12.5 mg total) by mouth daily., Disp: 30 tablet, Rfl: 2 .  nitroGLYCERIN (NITROSTAT) 0.4 MG SL tablet, Place 1 tablet (0.4 mg total) under the tongue every 5 (five) minutes as needed for up to 25 days for chest pain., Disp: 25 tablet, Rfl: 3 .  Study - EVOLVE-MI - evolocumab (REPATHA) 140 mg/mL SQ injection (PI-Stuckey), Inject 1 mL (140 mg total) into the skin every 14 (fourteen) days. For Investigational Use Only. Inject subcutaneously into abdomen, thigh, or upper arm every 14 days. Rotate injection sites and do not inject into areas where skin is tender, bruised, or red. Please contact [facility] for any questions or concerns regarding this medication., Disp: 12 mL, Rfl: 0 .  ticagrelor (BRILINTA) 90 MG TABS tablet, Take 1 tablet (90 mg total) by mouth 2 (two) times daily., Disp: 60 tablet, Rfl: 0  Laboratory examination:   Lab Results  Component Value Date   NA 138 09/22/2021   K 4.7 09/22/2021   CO2 24 09/22/2021   GLUCOSE 100 (H) 09/22/2021   BUN <5 (L) 09/22/2021   CREATININE 0.84 09/22/2021   CALCIUM 9.4 09/22/2021   GFRNONAA >60 09/22/2021       Latest Ref Rng & Units  09/22/2021   11:18 AM 08/26/2021    6:04 AM 08/26/2021   12:33 AM  CMP  Glucose 70 - 99 mg/dL 100  109  134   BUN 8 - 23 mg/dL '5  7  13   '$ Creatinine 0.44 - 1.00 mg/dL 0.84  0.54  0.60   Sodium 135 - 145 mmol/L 138  140  140   Potassium 3.5 - 5.1 mmol/L 4.7  3.5  3.8   Chloride 98 - 111 mmol/L 106  110  103  CO2 22 - 32 mmol/L 24  23    Calcium 8.9 - 10.3 mg/dL 9.4  8.6    Total Protein 6.5 - 8.1 g/dL 5.9     Total Bilirubin 0.3 - 1.2 mg/dL 0.8     Alkaline Phos 38 - 126 U/L 159     AST 15 - 41 U/L 24     ALT 0 - 44 U/L 20         Latest Ref Rng & Units 09/22/2021   11:18 AM 08/26/2021    6:04 AM 08/26/2021   12:33 AM  CBC  WBC 4.0 - 10.5 K/uL 7.9  10.5    Hemoglobin 12.0 - 15.0 g/dL 14.2  13.2  12.6   Hematocrit 36.0 - 46.0 % 41.5  37.7  37.0   Platelets 150 - 400 K/uL 266  227      Lipid Panel Recent Labs    08/26/21 0027  CHOL 147  TRIG 94  LDLCALC 103*  VLDL 19  HDL 25*  CHOLHDL 5.9    HEMOGLOBIN A1C Lab Results  Component Value Date   HGBA1C 5.4 08/26/2021   MPG 108.28 08/26/2021   TSH No results for input(s): "TSH" in the last 8760 hours.  External labs:     Radiology:    Cardiac Studies:   PCV ECHOCARDIOGRAM COMPLETE 09/10/2021  Narrative Echocardiogram 06/11/8339 Normal LV systolic function with visual EF 60-65%. Left ventricle cavity is normal in size. Normal left ventricular wall thickness. Normal global wall motion. Normal diastolic filling pattern, normal LAP. Structurally normal trileaflet aortic valve.  Mild (Grade I) aortic regurgitation. Structurally normal mitral valve.  Mild to moderate mitral regurgitation. Structurally normal tricuspid valve.  Mild tricuspid regurgitation. IVC is dilated with respiratory variation.     No results found for this or any previous visit from the past 1095 days.     EKG:       Assessment     ICD-10-CM   1. Acute myocardial infarction of anterolateral wall, initial episode of care ([place])  I21.09      2. Orthostatic hypotension  I95.1     3. Hyperlipidemia LDL goal <70  E78.5        No orders of the defined types were placed in this encounter.   No orders of the defined types were placed in this encounter.   Medications Discontinued During This Encounter  Medication Reason  . losartan (COZAAR) 25 MG tablet      Recommendations:   TONYE TANCREDI is a 71 y.o. female here for a follow-up visit after acute MI last month  Continue current cardiac medications with the following changes: stop Losartan.  Take ToprolXL in the evening. Continue aspirin and brillinta Encourage low-sodium diet, less than 2000 mg daily. Keep scheduled appointment with Dr Hilaria Ota, DO  09/25/2021, 2:55 PM Office: [phone] Pager: [phone]

## 2021-09-25 NOTE — Patient Instructions (Signed)
Continue current cardiac medications with the following changes: stop Losartan.  Take ToprolXL in the evening. Continue aspirin and brillinta Encourage low-sodium diet, less than 2000 mg daily. Keep scheduled appointment with Dr Einar Gip

## 2021-09-28 ENCOUNTER — Telehealth: Payer: Self-pay | Admitting: Internal Medicine

## 2021-09-28 ENCOUNTER — Other Ambulatory Visit: Payer: Self-pay

## 2021-09-28 ENCOUNTER — Other Ambulatory Visit: Payer: Self-pay | Admitting: Internal Medicine

## 2021-09-28 MED ORDER — ROSUVASTATIN CALCIUM 20 MG PO TABS
20.0000 mg | ORAL_TABLET | Freq: Every day | ORAL | 2 refills | Status: DC
  Filled 2021-09-28: qty 30, 30d supply, fill #0

## 2021-09-28 NOTE — Telephone Encounter (Signed)
Pt is aware of the new medication

## 2021-09-28 NOTE — Telephone Encounter (Signed)
Tell her to stop lipitor and I will send crestor to start at nighttime. Tell her it doesn't interact with anything and does not have side effects.

## 2021-09-28 NOTE — Telephone Encounter (Signed)
Please advise 

## 2021-09-28 NOTE — Telephone Encounter (Signed)
Patient says she was told to stay on lipitor at last appointment, but Friday she was "panting" after taking medication. She says Sunday she stopped taking lipitor, and she stopped panting but she didn't feel good. She's asking if someone can call her back to discuss further.

## 2021-09-29 ENCOUNTER — Telehealth (HOSPITAL_COMMUNITY): Payer: Self-pay

## 2021-09-29 ENCOUNTER — Other Ambulatory Visit (HOSPITAL_COMMUNITY): Payer: Self-pay

## 2021-09-29 ENCOUNTER — Other Ambulatory Visit: Payer: Self-pay

## 2021-09-29 ENCOUNTER — Encounter: Payer: Self-pay | Admitting: Cardiology

## 2021-09-29 ENCOUNTER — Telehealth: Payer: Self-pay

## 2021-09-29 NOTE — Telephone Encounter (Signed)
Patient would like a work note to be extended to the end of September. Please advise.

## 2021-09-29 NOTE — Telephone Encounter (Signed)
Transitions of Care Pharmacy  ° °Call attempted for a pharmacy transitions of care follow-up. HIPAA appropriate voicemail was left with call back information provided.  ° °Call attempt #1. Will follow-up in 2-3 days.  °  °

## 2021-09-29 NOTE — Telephone Encounter (Signed)
Patient came to the office and states that she mentioned to you that after starting a new medication she still having trouble breathing. Medication changes were made and she still has complaints She doesn't have a f/u appt until 9/12 with [facility]. Her original work note was sent on 7/13 for 4 to 6 weeks. Please advise.

## 2021-09-29 NOTE — Telephone Encounter (Signed)
Sent to her Mychart in box

## 2021-10-01 ENCOUNTER — Telehealth (HOSPITAL_COMMUNITY): Payer: Self-pay

## 2021-10-01 NOTE — Telephone Encounter (Signed)
Pharmacy Transitions of Care Follow-up Telephone Call  Date of discharge: 08/27/2021  Discharge Diagnosis: STEMI  How have you been since you were released from the hospital? Patient has been doing well since discharge. However, she believes one of her medications may be contributing to her breathing difficulties. Patient has a follow-up appt with Cardiology 10/27/2021, but advised to contact PCP for a sooner appt if breathing difficulties continue.    Medication changes made at discharge: START taking: Aspirin Low Dose (aspirin)  EVOLVE-MI evolocumab   Medication changes verified by the patient? Yes    Medication Accessibility:  Home Pharmacy: [facility]    Was the patient provided with refills on discharged medications? No   Have all prescriptions been transferred from [facility] to home pharmacy? Yes   Is the patient interested in using a [facility]? Yes  Is the patient able to afford medications? Yes  Referred patient to patient care advocate for medication assistance? No  Medication Review:  TICAGRELOR (BRILINTA) Ticagrelor 90 mg BID - Discussed importance of taking medication around the same time every day.  - Advised patient of medications to avoid (NSAIDs, ASA maintenance doses>100 mg daily)  - Educated that Tylenol (acetaminophen) will be the preferred analgesic to prevent risk of bleeding. - Emphasized importance of monitoring for signs and symptoms of bleeding (abnormal bruising, prolonged bleeding, nose bleeds, bleeding from gums, discolored urine, black tarry stools)  - Advised patient to alert all providers of anticoagulation therapy prior to starting a new medication or having a procedure.  Follow-up Appointments:  [facility] f/u appt confirmed? Saw Sabina Custovic, DO on 09/25/2021. Scheduled to see Floydene Flock, DO on 10/27/21 @ 11:00 AM.   If their condition worsens, is the pt aware to call PCP or go to the Emergency Dept.?  Yes  Final Patient Assessment: Patient has had Cardiology follow-up and has a current prescription at home pharmacy.

## 2021-10-04 DIAGNOSIS — I5020 Unspecified systolic (congestive) heart failure: Secondary | ICD-10-CM | POA: Diagnosis not present

## 2021-10-04 DIAGNOSIS — I2510 Atherosclerotic heart disease of native coronary artery without angina pectoris: Secondary | ICD-10-CM | POA: Diagnosis not present

## 2021-10-04 DIAGNOSIS — R002 Palpitations: Secondary | ICD-10-CM | POA: Diagnosis not present

## 2021-10-09 ENCOUNTER — Other Ambulatory Visit: Payer: Medicare Other

## 2021-10-16 ENCOUNTER — Other Ambulatory Visit: Payer: Self-pay

## 2021-10-16 DIAGNOSIS — I2510 Atherosclerotic heart disease of native coronary artery without angina pectoris: Secondary | ICD-10-CM

## 2021-10-16 MED ORDER — METOPROLOL SUCCINATE ER 25 MG PO TB24: 12.5000 mg | ORAL_TABLET | Freq: Every day | ORAL | 2 refills | Status: DC

## 2021-10-20 ENCOUNTER — Telehealth: Payer: Self-pay | Admitting: Internal Medicine

## 2021-10-20 ENCOUNTER — Other Ambulatory Visit: Payer: Self-pay

## 2021-10-20 DIAGNOSIS — I2510 Atherosclerotic heart disease of native coronary artery without angina pectoris: Secondary | ICD-10-CM

## 2021-10-20 MED ORDER — METOPROLOL SUCCINATE ER 25 MG PO TB24: 12.5000 mg | ORAL_TABLET | Freq: Every day | ORAL | 2 refills | Status: DC

## 2021-10-20 NOTE — Telephone Encounter (Signed)
Patient called in regarding her medication says that she has recently started itching . Asked patient has any new medications been added to her med list . Patient states that Crestor has been added wasn't having the itching before . Please call patient on next steps . Thank you

## 2021-10-20 NOTE — Telephone Encounter (Signed)
Spoke with patient and she states that she is not taking the repatha and states that it has to be one of the other , medications that she was prescribed.  Please advise

## 2021-10-20 NOTE — Telephone Encounter (Signed)
Probably from repatha, if not tolerable she can stop it. She needs to take all of the other meds as she had heart attack

## 2021-10-20 NOTE — Telephone Encounter (Signed)
LVM for patient to return call. 

## 2021-10-20 NOTE — Telephone Encounter (Signed)
None of the others cause that

## 2021-10-27 ENCOUNTER — Encounter: Payer: Self-pay | Admitting: Internal Medicine

## 2021-10-27 ENCOUNTER — Ambulatory Visit: Payer: Medicare Other | Admitting: Internal Medicine

## 2021-10-27 ENCOUNTER — Other Ambulatory Visit (HOSPITAL_COMMUNITY): Payer: Self-pay

## 2021-10-27 ENCOUNTER — Other Ambulatory Visit: Payer: Self-pay

## 2021-10-27 VITALS — BP 94/61 | HR 75 | Temp 98.0°F | Resp 16 | Ht 62.0 in | Wt 96.6 lb

## 2021-10-27 DIAGNOSIS — L299 Pruritus, unspecified: Secondary | ICD-10-CM | POA: Diagnosis not present

## 2021-10-27 DIAGNOSIS — E785 Hyperlipidemia, unspecified: Secondary | ICD-10-CM

## 2021-10-27 DIAGNOSIS — I252 Old myocardial infarction: Secondary | ICD-10-CM | POA: Diagnosis not present

## 2021-10-27 MED ORDER — TICAGRELOR 90 MG PO TABS
90.0000 mg | ORAL_TABLET | Freq: Two times a day (BID) | ORAL | 0 refills | Status: DC
Start: 2021-10-27 — End: 2021-11-27
  Filled 2021-10-27: qty 60, 30d supply, fill #0

## 2021-10-27 NOTE — Progress Notes (Signed)
Primary Physician/Referring:  Caren Macadam, MD  Patient ID: Leslie Cross, female    DOB: 05/24/1950, 71 y.o.   MRN: [id number]  No chief complaint on file.  HPI:    Leslie Cross  is a 71 y.o. female who recently had an acute MI in July.  She did have a left heart catheterization with stent placement by Dr. Einar Gip.  She was recently seen in office for c/o feeling very lightheaded especially when she changes position.  Losartan was stopped at this time and she was advised to continue metoprolol 12.42mg. She is here today with c/o itching over entire body especially back and lower legs. She believes the itching started when she was started on crestor. She is also still having episodes of dizziness when change positions and is having difficulty staying asleep through the night. She is also concerned about increased bruising on both arms, she does do extensive yard work and house work daily. She denies chest pain, shortness of breath, melena, or blood in urine.   Past Medical History:  Diagnosis Date   [place]   Recurrent acute deep vein [place] (DVT) of [place] 12/12/2018   Past Surgical History:  Procedure Laterality Date   ABDOMINAL HYSTERECTOMY     CARDIAC CATHETERIZATION     CORONARY/GRAFT ACUTE MI REVASCULARIZATION N/A 08/26/2021   Procedure: Coronary/Graft Acute MI Revascularization;  Surgeon: GAdrian Prows MD;  [facility];  Service: Cardiovascular;  Laterality: N/A;   LEFT HEART CATH AND CORONARY ANGIOGRAPHY N/A 08/26/2021   Procedure: LEFT HEART CATH AND CORONARY ANGIOGRAPHY;  Surgeon: GAdrian Prows MD;  [facility];  Service: Cardiovascular;  Laterality: N/A;   Family History  Problem Relation Age of Onset   Heart failure Father    Stroke Father 625  Heart attack Sister    Suicidality Sister    Prostate cancer Brother    CAD Neg Hx    Diabetes Mellitus I Neg Hx     Social History   Tobacco  Use   Smoking status: Every Day    Packs/day: 0.25    Types: Cigarettes   Smokeless tobacco: Never   Tobacco comments:    Cut back on smoking, 3 cigarettes daily  Substance Use Topics   Alcohol use: No   Marital Status: Single  ROS  Review of Systems  HENT:  Negative for nosebleeds.   Cardiovascular:  Positive for near-syncope. Negative for chest pain, dyspnea on exertion and leg swelling.  Respiratory:  Negative for hemoptysis, shortness of breath and sleep disturbances due to breathing.   Hematologic/Lymphatic: Bruises/bleeds easily (bruising).  Gastrointestinal:  Negative for melena.  Neurological:  Positive for light-headedness.  Psychiatric/Behavioral:  The patient has insomnia.   All other systems reviewed and are negative.  Objective  Blood pressure 94/61, pulse 75, temperature 98 F (36.7 C), temperature source Temporal, resp. rate 16, height '5\' 2"'$  (1.575 m), weight 96 lb 9.6 oz (43.8 kg), SpO2 100 %. Body mass index is 17.67 kg/m.     10/27/2021   10:44 AM 09/25/2021    1:56 PM 09/22/2021    2:30 PM  Vitals with BMI  Height '5\' 2"'$  '5\' 2"'$    Weight 96 lbs 10 oz 95 lbs   BMI 158.09198.33  Systolic 94 [id number] Diastolic 61 55 55  Pulse 75 76 77     Physical Exam Vitals reviewed.  Constitutional:      Appearance: She  is well-developed.  HENT:     Head: Normocephalic and atraumatic.  Cardiovascular:     Rate and Rhythm: Normal rate and regular rhythm.     Pulses: Normal pulses.     Heart sounds: Normal heart sounds. No murmur heard. Pulmonary:     Effort: Pulmonary effort is normal.     Breath sounds: Normal breath sounds.  Abdominal:     General: Bowel sounds are normal.     Palpations: Abdomen is soft.  Musculoskeletal:     Right lower leg: No edema.     Left lower leg: No edema.  Skin:    General: Skin is warm and dry.     Findings: Lesion and rash present.     Comments: Open, pruritic,, erythematous lesions on upper back and bilateral lower extremities.  Pustules noted on right shoulder and upper back.  Neurological:     Mental Status: She is alert.    Medications and allergies   Allergies  Allergen Reactions   Alpha-Gal Anaphylaxis   Aminoglycosides Anaphylaxis   Beef-Derived Products Anaphylaxis    Alpha-gal   Erythromycin Anaphylaxis   Keflex [Cephalexin] Anaphylaxis   Other Anaphylaxis and Other (See Comments)    Drugs ending in "cillin" - Anaphylaxis Drugs ending in "mycin" - Anaphylaxis     Pork-Derived Products Anaphylaxis    Alpha-gal   Penicillins Other (See Comments)    Did it involve swelling of the face/tongue/throat, SOB, or low BP? Unknown Did it involve sudden or severe rash/hives, skin peeling, or any reaction on the inside of your mouth or nose? Unknown Did you need to seek medical attention at a hospital or doctor's office? Unknown When did it last happen?      10 years If all above answers are "NO", may proceed with cephalosporin use.   Vitamin E Diarrhea    Bloody diarrhea     Medication list after today's encounter   Current Outpatient Medications:    Ascorbic Acid (VITAMIN C PO), Take 1 tablet by mouth every evening., Disp: , Rfl:    aspirin 81 MG chewable tablet, Chew 1 tablet (81 mg total) by mouth daily., Disp: 30 tablet, Rfl: 11   Cholecalciferol (VITAMIN D-3 PO), Take 1 capsule by mouth every evening., Disp: , Rfl:    FERROUS SULFATE PO, Take 1 tablet by mouth every evening., Disp: , Rfl:    metoprolol succinate (TOPROL-XL) 25 MG 24 hr tablet, Take 0.5 tablets (12.5 mg total) by mouth daily., Disp: 30 tablet, Rfl: 2   Study - EVOLVE-MI - evolocumab (REPATHA) 140 mg/mL SQ injection (PI-Stuckey), Inject 1 mL (140 mg total) into the skin every 14 (fourteen) days. For Investigational Use Only. Inject subcutaneously into abdomen, thigh, or upper arm every 14 days. Rotate injection sites and do not inject into areas where skin is tender, bruised, or red. Please contact [facility] for any questions or concerns regarding this medication., Disp: 12 mL, Rfl: 0   nitroGLYCERIN (NITROSTAT) 0.4 MG SL tablet, Place 1 tablet (0.4 mg total) under the tongue every 5 (five) minutes as needed for up to 25 days for chest pain., Disp: 25 tablet, Rfl: 3   ticagrelor (BRILINTA) 90 MG TABS tablet, Take 1 tablet (90 mg total) by mouth 2 (two) times daily., Disp: 60 tablet, Rfl: 0  Laboratory examination:   Lab Results  Component Value Date   NA 138 09/22/2021   K 4.7 09/22/2021   CO2 24 09/22/2021   GLUCOSE 100 (  H) 09/22/2021   BUN <5 (L) 09/22/2021   CREATININE 0.84 09/22/2021   CALCIUM 9.4 09/22/2021   GFRNONAA >60 09/22/2021       Latest Ref Rng & Units 09/22/2021   11:18 AM 08/26/2021    6:04 AM 08/26/2021   12:33 AM  CMP  Glucose 70 - 99 mg/dL 100  109  134   BUN 8 - 23 mg/dL '5  7  13   '$ Creatinine 0.44 - 1.00 mg/dL 0.84  0.54  0.60   Sodium 135 - 145 mmol/L 138  140  140   Potassium 3.5 - 5.1 mmol/L 4.7  3.5  3.8   Chloride 98 - 111 mmol/L 106  110  103   CO2 22 - 32 mmol/L 24  23    Calcium 8.9 - 10.3 mg/dL 9.4  8.6    Total Protein 6.5 - 8.1 g/dL 5.9     Total Bilirubin 0.3 - 1.2 mg/dL 0.8     Alkaline Phos 38 - 126 U/L 159     AST 15 - 41 U/L 24     ALT 0 - 44 U/L 20         Latest Ref Rng & Units 09/22/2021   11:18 AM 08/26/2021    6:04 AM 08/26/2021   12:33 AM  CBC  WBC 4.0 - 10.5 K/uL 7.9  10.5    Hemoglobin 12.0 - 15.0 g/dL 14.2  13.2  12.6   Hematocrit 36.0 - 46.0 % 41.5  37.7  37.0   Platelets 150 - 400 K/uL 266  227      Lipid Panel Recent Labs    08/26/21 0027  CHOL 147  TRIG 94  LDLCALC 103*  VLDL 19  HDL 25*  CHOLHDL 5.9    HEMOGLOBIN A1C Lab Results  Component Value Date   HGBA1C 5.4 08/26/2021   MPG 108.28 08/26/2021   TSH No results for input(s): "TSH" in the last 8760 hours.  External labs:     Radiology:    Cardiac Studies:   PCV ECHOCARDIOGRAM COMPLETE 09/10/2021  Narrative Echocardiogram  03/13/5168 Normal LV systolic function with visual EF 60-65%. Left ventricle cavity is normal in size. Normal left ventricular wall thickness. Normal global wall motion. Normal diastolic filling pattern, normal LAP. Structurally normal trileaflet aortic valve.  Mild (Grade I) aortic regurgitation. Structurally normal mitral valve.  Mild to moderate mitral regurgitation. Structurally normal tricuspid valve.  Mild tricuspid regurgitation. IVC is dilated with respiratory variation.     No results found for this or any previous visit from the past 1095 days.  EKG:   Assessment     ICD-10-CM   1. History of ST elevation myocardial infarction (STEMI)  I25.2     2. Hyperlipidemia LDL goal <70  E78.5     3. Itching  L29.9        No orders of the defined types were placed in this encounter.   No orders of the defined types were placed in this encounter.   Medications Discontinued During This Encounter  Medication Reason   rosuvastatin (CRESTOR) 20 MG tablet Side effect (s)   rosuvastatin (CRESTOR) 20 MG tablet Side effect (s)     Recommendations:   Leslie Cross is a 71 y.o. female here for a follow-up visit after acute MI last month  History of ST elevation myocardial infarction (STEMI)  Blood pressure is well controlled Continue current cardiac medications Advised patients to moving slowly from sitting to standing positions We  also discussed safety while on Brilinta and Aspirin  Hyperlipidemia LDL goal <70  Pt strongly feels that Crestor is contributing to itching. Will stop Crestor and will monitor itching. Continue Repatha.  Encouraged patient to follow-up with PCP for pruritic lesions due to concern for other causes of itching and skin lesions.   Encouraged low-sodium diet, less than 2000 mg daily.  Will follow-up in 3 months of sooner if needed.    Ernst Spell, NP  10/27/2021, 11:32 AM Office: [phone] Pager: [phone]

## 2021-10-29 DIAGNOSIS — I2510 Atherosclerotic heart disease of native coronary artery without angina pectoris: Secondary | ICD-10-CM | POA: Diagnosis not present

## 2021-10-30 LAB — LIPID PANEL WITH LDL/HDL RATIO
Cholesterol, Total: 62 mg/dL — ABNORMAL LOW (ref 100–199)
HDL: 43 mg/dL (ref >39)
LDL Chol Calc (NIH): 0 mg/dL (ref 0–99)
LDL/HDL Ratio: 0 ratio (ref 0.0–3.2)
Triglycerides: 97 mg/dL (ref 0–149)
VLDL Cholesterol Cal: 19 mg/dL (ref 5–40)

## 2021-11-06 DIAGNOSIS — F17201 Nicotine dependence, unspecified, in remission: Secondary | ICD-10-CM | POA: Diagnosis not present

## 2021-11-06 DIAGNOSIS — D649 Anemia, unspecified: Secondary | ICD-10-CM | POA: Diagnosis not present

## 2021-11-06 DIAGNOSIS — E7800 Pure hypercholesterolemia, unspecified: Secondary | ICD-10-CM | POA: Diagnosis not present

## 2021-11-06 DIAGNOSIS — Z79899 Other long term (current) drug therapy: Secondary | ICD-10-CM | POA: Diagnosis not present

## 2021-11-06 DIAGNOSIS — Z0000 Encounter for general adult medical examination without abnormal findings: Secondary | ICD-10-CM | POA: Diagnosis not present

## 2021-11-06 DIAGNOSIS — R748 Abnormal levels of other serum enzymes: Secondary | ICD-10-CM | POA: Diagnosis not present

## 2021-11-06 DIAGNOSIS — I2510 Atherosclerotic heart disease of native coronary artery without angina pectoris: Secondary | ICD-10-CM | POA: Diagnosis not present

## 2021-11-09 ENCOUNTER — Telehealth: Payer: Self-pay

## 2021-11-09 NOTE — Telephone Encounter (Signed)
Patient is requesting a letter to return to work 11/18/2021. Please let front desk know when letter is complete so patient can come pick up letter.

## 2021-11-17 DIAGNOSIS — R748 Abnormal levels of other serum enzymes: Secondary | ICD-10-CM | POA: Diagnosis not present

## 2021-11-27 ENCOUNTER — Other Ambulatory Visit: Payer: Self-pay | Admitting: Internal Medicine

## 2021-11-27 MED ORDER — TICAGRELOR 90 MG PO TABS: 90.0000 mg | ORAL_TABLET | Freq: Two times a day (BID) | ORAL | 6 refills | Status: DC

## 2021-12-02 ENCOUNTER — Telehealth: Payer: Self-pay | Admitting: *Deleted

## 2021-12-02 NOTE — Telephone Encounter (Signed)
Called patient for 12 week phone call follow up for the Evolve study. No voicemail set up will call patient again.    [place] Manasi Dishon,research Coordinator 12/02/2021  14:51

## 2021-12-04 ENCOUNTER — Ambulatory Visit: Payer: Medicare Other | Admitting: Cardiology

## 2021-12-09 ENCOUNTER — Telehealth: Payer: Self-pay

## 2021-12-09 NOTE — Telephone Encounter (Signed)
Tried calling patient no answer left a vm to call back

## 2021-12-09 NOTE — Telephone Encounter (Signed)
Patient called stating since starting on Brilinta she noticed her whole right side has been swollen. First, started with her right hand being swollen and numb than to her right foot, the top of her foot and her ankle were swollen up to her right leg and now she is having pain on her right side of her abdominal. Patient stated she did not feel these before but noticed it when she started Brilinta please advise

## 2021-12-09 NOTE — Telephone Encounter (Signed)
She has been on Brilinta since July 2023, she was seen in office a few times since then and did not complain of these symptoms. I have low suspicion that these symptoms are related to [place]. I would advise her to schedule an appointment with PCP or she can schedule with our office if she prefers that.

## 2021-12-10 NOTE — Telephone Encounter (Signed)
Called and spoke to patient she stated that her body usually starts rejecting medication after she has been on them for a while, she usually doesn't do well with taking medications. Patient didn't say anything about scheduling with [place] or PCP

## 2021-12-14 DIAGNOSIS — G3184 Mild cognitive impairment, so stated: Secondary | ICD-10-CM | POA: Diagnosis not present

## 2021-12-14 DIAGNOSIS — M79673 Pain in unspecified foot: Secondary | ICD-10-CM | POA: Diagnosis not present

## 2021-12-14 DIAGNOSIS — R1011 Right upper quadrant pain: Secondary | ICD-10-CM | POA: Diagnosis not present

## 2021-12-14 DIAGNOSIS — L659 Nonscarring hair loss, unspecified: Secondary | ICD-10-CM | POA: Diagnosis not present

## 2021-12-15 ENCOUNTER — Other Ambulatory Visit: Payer: Self-pay | Admitting: Physician Assistant

## 2021-12-15 DIAGNOSIS — R1011 Right upper quadrant pain: Secondary | ICD-10-CM

## 2021-12-30 ENCOUNTER — Telehealth: Payer: Self-pay | Admitting: *Deleted

## 2021-12-30 NOTE — Telephone Encounter (Signed)
Called patient for 12 week phone call follow up for the EVOLVE study. Asked Daughter to have her all me.     [place] Nabilah Davoli, Research Coordinator  12/30/2021 11:30 a.m.

## 2022-01-01 ENCOUNTER — Encounter: Payer: Self-pay | Admitting: *Deleted

## 2022-01-01 DIAGNOSIS — Z006 Encounter for examination for normal comparison and control in clinical research program: Secondary | ICD-10-CM

## 2022-01-01 NOTE — Research (Signed)
Follow-Up Visit Completed*   '[]'$  Not Necessary, No Potential Adverse Events Or Medication Issues Reported On Completed Subject Questionnaire   '[x]'$  Yes, Contact With Subject/Alternate Contact Completed   '[]'$  Yes, No Contact With Subject/Alternate Contact Completed, But Electronic Health Record Was Reviewed   '[]'$  No, Unable To Contact Subject/Alternate Contact   Have you reviewed Ongoing medications on the Targeted Concomitant Medication form and updated the form as needed?   '[x]'$  Yes   '[]'$  No   Subject Status*   '[x]'$  Continuing In Follow-up   '[]'$  At Risk For Lost To Follow-up   '[]'$  Withdrawal From All Future Study Activities Including Passive Follow-up By [place] Record Review Or Contact With Healthcare Provider Or Family Member/Friend   '[]'$  Death   Vital Status*   '[x]'$  Alive   '[]'$  Deceased   '[]'$  Unknown   Last Known To Be Alive Source*   '[x]'$  Subject Completed Follow-up Questionnaire/Seen In Person/Via Telephone Contact   '[]'$  Family Member or Caretaker   '[]'$  Primary Physician Or Medical Records   '[]'$  Publicly Available Source   '[]'$  Other  Date of last dose taken   31-Dec-2021 Over the last 12 weeks did the subject miss any doses? NO  Over the last 12 weeks did the subject restart Evolocumab after an interruption?NO   Current Outpatient Medications:    Ascorbic Acid (VITAMIN C PO), Take 1 tablet by mouth every evening., Disp: , Rfl:    aspirin 81 MG chewable tablet, Chew 1 tablet (81 mg total) by mouth daily., Disp: 30 tablet, Rfl: 11   Cholecalciferol (VITAMIN D-3 PO), Take 1 capsule by mouth every evening., Disp: , Rfl:    FERROUS SULFATE PO, Take 1 tablet by mouth every evening., Disp: , Rfl:    metoprolol succinate (TOPROL-XL) 25 MG 24 hr tablet, Take 0.5 tablets (12.5 mg total) by mouth daily., Disp: 30 tablet, Rfl: 2   nitroGLYCERIN (NITROSTAT) 0.4 MG SL tablet, Place 1 tablet (0.4 mg total) under the tongue every 5 (five) minutes as needed for up to 25  days for chest pain., Disp: 25 tablet, Rfl: 3   Study - EVOLVE-MI - evolocumab (REPATHA) 140 mg/mL SQ injection (PI-Stuckey), Inject 1 mL (140 mg total) into the skin every 14 (fourteen) days. For Investigational Use Only. Inject subcutaneously into abdomen, thigh, or upper arm every 14 days. Rotate injection sites and do not inject into areas where skin is tender, bruised, or red. Please contact [facility] for any questions or concerns regarding this medication., Disp: 12 mL, Rfl: 0   ticagrelor (BRILINTA) 90 MG TABS tablet, Take 1 tablet (90 mg total) by mouth 2 (two) times daily., Disp: 60 tablet, Rfl: 6

## 2022-01-12 ENCOUNTER — Other Ambulatory Visit: Payer: Self-pay

## 2022-01-12 DIAGNOSIS — I2510 Atherosclerotic heart disease of native coronary artery without angina pectoris: Secondary | ICD-10-CM

## 2022-01-12 MED ORDER — METOPROLOL SUCCINATE ER 25 MG PO TB24: 12.5000 mg | ORAL_TABLET | Freq: Every day | ORAL | 2 refills | Status: DC

## 2022-02-12 ENCOUNTER — Other Ambulatory Visit: Payer: Self-pay | Admitting: Internal Medicine

## 2022-02-12 MED ORDER — CLOPIDOGREL BISULFATE 75 MG PO TABS: 75.0000 mg | ORAL_TABLET | Freq: Every day | ORAL | 3 refills | Status: DC

## 2022-02-17 ENCOUNTER — Encounter: Payer: Self-pay | Admitting: *Deleted

## 2022-02-17 DIAGNOSIS — Z006 Encounter for examination for normal comparison and control in clinical research program: Secondary | ICD-10-CM

## 2022-02-17 MED ORDER — REPATHA SURECLICK 140 MG/ML ~~LOC~~ SOAJ: 140.0000 mg | SUBCUTANEOUS | 3 refills | Status: AC

## 2022-02-17 NOTE — Research (Signed)
Orders only

## 2022-02-25 ENCOUNTER — Encounter: Payer: Self-pay | Admitting: *Deleted

## 2022-02-25 DIAGNOSIS — Z006 Encounter for examination for normal comparison and control in clinical research program: Secondary | ICD-10-CM

## 2022-02-25 NOTE — Research (Signed)
[place] Visit Completed*   '[]'$  Not Necessary, No Potential Adverse Events Or Medication Issues Reported On Completed Subject Questionnaire   '[x]'$  Yes, Contact With Subject/Alternate Contact Completed   '[]'$  Yes, No Contact With Subject/Alternate Contact Completed, But Electronic Health Record Was Reviewed   '[]'$  No, Unable To Contact Subject/Alternate Contact   Have you reviewed Ongoing medications on the Targeted Concomitant Medication form and updated the form as needed?   '[x]'$  Yes   '[]'$  No   Subject Status*   '[x]'$  Continuing In Follow-up   '[]'$  At Risk For Lost To Follow-up   '[]'$  Withdrawal From All Future Study Activities Including Passive Follow-up By [place] Record Review Or Contact With Healthcare Provider Or Family Member/Friend   '[]'$  Death   Vital Status*   '[]'$  Alive   '[]'$  Deceased   '[]'$  Unknown   Last Known To Be Alive Source*   '[x]'$  Subject Completed Follow-up Questionnaire/Seen In Person/Via Telephone Contact   '[]'$  Family Member or Caretaker   '[]'$  Primary Physician Or Medical Records   '[]'$  Publicly Available Source   '[]'$  Other  Date of last dose taken   28/DEC/2023  Over the last 12 weeks did the subject miss any doses? NO   Over the last 12 weeks did the subject restart Evolocumab after an interruption?NO

## 2022-03-09 ENCOUNTER — Ambulatory Visit: Payer: Medicare Other | Admitting: Cardiovascular Disease

## 2022-03-18 ENCOUNTER — Encounter: Payer: Self-pay | Admitting: Cardiology

## 2022-03-18 ENCOUNTER — Ambulatory Visit: Payer: Medicare Other | Attending: Cardiovascular Disease | Admitting: Cardiology

## 2022-03-18 VITALS — BP 96/64 | HR 89 | Ht 62.0 in | Wt 103.0 lb

## 2022-03-18 DIAGNOSIS — R0602 Shortness of breath: Secondary | ICD-10-CM

## 2022-03-18 NOTE — Progress Notes (Signed)
Cardiology Office Note:    Date:  03/18/2022   ID:  [name], DOB 1951-01-08, MRN [id number]  PCP:  Leslie Cross, [place] Providers Cardiologist:  None     Referring MD: Leslie Macadam, MD    History of Present Illness:    Leslie Cross is a 72 y.o. female here for the evaluation of coronary artery disease status postmyocardial infarction of the anterior wall.  Former patient of Dr. Shellia Cross with [facility] cardiology.  Cardiac catheterization 08/26/2021 by Dr. Einar Gip with stent placed to LAD, thrombectomy, ejection fraction originally 40 to 45% with subsequent echocardiogram showing normal ejection fraction of 60 to 65%.  Original troponin was 235.  LDL 0, total cholesterol 62 HDL 43 on Repatha.  Crestor thought to be related to itching. LP(a) 12.5  Prior peroneal vein DVT on 08/25/2018 vascular ultrasound.  Previously in October 2023 called with whole right side swollen right hand numb right foot swollen and she thought it may have been secondary to [place].  Because of this, she was switched to Plavix on 02/12/2022  Having trouble with Plavix. 1/3 of my hair is gone. Panting more especially with bending over.  More shortness of breath with activity.  No change after stopping Brilinta.  More wrinkles than ever had. 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 Feels memory is not as good as it used to be.   Past Medical History:  Diagnosis Date   Acute deep vein thrombosis (DVT) of right lower extremity ([place]) 08/25/2018   Acute myocardial infarction of anterolateral wall, initial episode of care ([place]) 09/25/2021   Acute ST elevation myocardial infarction (STEMI) of anterior wall ([place]) 08/26/2021   B12 deficiency 08/21/2020   Hyperlipidemia LDL goal <70 09/25/2021   LAD stenosis    NSVT (nonsustained ventricular tachycardia) ([place])    Pulmonary emboli ([place]) 08/29/2018   Pulmonary infarct ([place]) 08/25/2018   Recurrent acute deep vein thrombosis (DVT) of  lower extremity ([place]) 12/12/2018    Past Surgical History:  Procedure Laterality Date   ABDOMINAL HYSTERECTOMY     CARDIAC CATHETERIZATION     CORONARY/GRAFT ACUTE MI REVASCULARIZATION N/A 08/26/2021   Procedure: Coronary/Graft Acute MI Revascularization;  Surgeon: Adrian Prows, MD;  Location: [facility];  Service: Cardiovascular;  Laterality: N/A;   LEFT HEART CATH AND CORONARY ANGIOGRAPHY N/A 08/26/2021   Procedure: LEFT HEART CATH AND CORONARY ANGIOGRAPHY;  Surgeon: Adrian Prows, MD;  Location: [facility];  Service: Cardiovascular;  Laterality: N/A;    Current Medications: Current Meds  Medication Sig   Ascorbic Acid (VITAMIN C PO) Take 1 tablet by mouth every evening.   aspirin 81 MG chewable tablet Chew 1 tablet (81 mg total) by mouth daily.   Cholecalciferol (VITAMIN D-3 PO) Take 1 capsule by mouth every evening.   clopidogrel (PLAVIX) 75 MG tablet Take 1 tablet (75 mg total) by mouth daily.   FERROUS SULFATE PO Take 1 tablet by mouth every evening.   nitroGLYCERIN (NITROSTAT) 0.4 MG SL tablet Place 1 tablet (0.4 mg total) under the tongue every 5 (five) minutes as needed for up to 25 days for chest pain.   REPATHA SURECLICK 262 MG/ML SOAJ Inject 140 mg into the skin every 14 (fourteen) days for 12 doses.   Study - EVOLVE-MI - evolocumab (REPATHA) 140 mg/mL SQ injection (PI-Stuckey) Inject 1 mL (140 mg total) into the skin every 14 (fourteen) days. For Investigational Use Only. Inject subcutaneously into abdomen, thigh, or upper  arm every 14 days. Rotate injection sites and do not inject into areas where skin is tender, bruised, or red. Please contact [facility] for any questions or concerns regarding this medication.   [DISCONTINUED] metoprolol succinate (TOPROL-XL) 25 MG 24 hr tablet Take 0.5 tablets (12.5 mg total) by mouth daily.     Allergies:   Alpha-gal, Aminoglycosides, Beef-derived products, Erythromycin, Keflex  [cephalexin], Other, Pork-derived products, Penicillins, and Vitamin e   Social History   Socioeconomic History   Marital status: Single    Spouse name: Not on file   Number of children: 3   Years of education: Not on file   Highest education level: Not on file  Occupational History   Not on file  Tobacco Use   Smoking status: Every Day    Packs/day: 0.25    Types: Cigarettes   Smokeless tobacco: Never   Tobacco comments:    Cut back on smoking, 3 cigarettes daily  Vaping Use   Vaping Use: Never used  Substance and Sexual Activity   Alcohol use: No   Drug use: No   Sexual activity: Yes  Other Topics Concern   Not on file  Social History Narrative   1 child passed from cancer   1 child living with cancer   Social Determinants of Health   Financial Resource Strain: Unknown (08/25/2018)   Overall Financial Resource Strain (CARDIA)    Difficulty of Paying Living Expenses: Patient refused  Food Insecurity: Unknown (08/25/2018)   Hunger Vital Sign    Worried About Running Out of Food in the Last Year: Patient refused    [place] in the Last Year: Patient refused  Transportation Needs: Unknown (08/25/2018)   PRAPARE - Transportation    Lack of Transportation (Medical): Patient refused    Lack of Transportation (Non-Medical): Patient refused  Physical Activity: Unknown (08/25/2018)   Exercise Vital Sign    Days of Exercise per Week: Patient refused    Minutes of Exercise per Session: Patient refused  Stress: Not on file  Social Connections: Unknown (08/25/2018)   Social Connection and Isolation Panel [NHANES]    Frequency of Communication with Friends and Family: Patient refused    Frequency of Social Gatherings with Friends and Family: Patient refused    Attends Religious Services: Patient refused    Marine scientist or Organizations: Patient refused    Attends Archivist Meetings: Patient refused    Marital Status: Patient refused     Family  History: The patient's family history includes Heart attack in her sister; Heart failure in her father; Prostate cancer in her brother; Stroke (age of onset: 65) in her father; Suicidality in her sister. There is no history of CAD or Diabetes Mellitus I.  ROS:   Please see the history of present illness.     All other systems reviewed and are negative.  EKGs/Labs/Other Studies Reviewed:    The following studies were reviewed today:  Left Heart Catheterization 08/26/21:  LV: Hemodynamics: 107/45, EDP 16 mmHg.  Ao 116/64 mmHg.  There was no pressure gradient across the aortic valve. Mild to moderate decrease in LVEF, 40 to 45% with mid to distal anterior and apical and apical inferior akinesis. RCA: Very large caliber vessel and a dominant vessel.  Very minimal disease is evident.  Large PDA. LM: Moderate caliber vessel.  No significant disease. LAD: Gives origin to large D1, followed by thrombotic occlusion.  TIMI 0 flow.  There is  a small D2 and several small diagonals distally. CX: Proximal circumflex has a 50 to 60% irregular stenosis.  Moderate-sized OM1 with mild disease.  Interventional data: Successful thrombectomy with a Pronto 5.5 with extraction of significant amount of thrombus followed by direct stenting to LAD with 2.5 x 28 mm Synergy XT DES at 16 atmospheric pressure, stenosis reduced from 100% to 0% with TIMI 0 to TIMI-3 flow.  115 mL contrast utilized.  Echocardiogram 08/11/2021:  Normal LV systolic function with visual EF 60-65%. Left ventricle cavity is normal in size. Normal left ventricular wall thickness. Normal global wall motion. Normal diastolic filling pattern, normal LAP. Structurally normal trileaflet aortic valve.  Mild (Grade I) aortic regurgitation. Structurally normal mitral valve.  Mild to moderate mitral regurgitation. Structurally normal tricuspid valve.  Mild tricuspid regurgitation. IVC is dilated with respiratory variation.  ZIO monitor  09/17/2021: Dominant rhythm sinus. Heart rate 51-174 bpm.  Avg HR 69 bpm. No atrial fibrillation, ventricular tachycardia, high grade AV block, pauses (3 seconds or longer). Total ventricular ectopic burden <1% (multifocal PVCs). Total supraventricular ectopic burden <1%. Rare, short duration, episodes of PSVT. Patient triggered events: 1.  Underlying rhythm sinus with artifact.  EKG: 09/23/2021-sinus rhythm T wave inversion anterior leads old anterior MI.  Recent Labs: 09/22/2021: ALT 20; B Natriuretic Peptide 98.2; BUN <5; Creatinine, Ser 0.84; Hemoglobin 14.2; Platelets 266; Potassium 4.7; Sodium 138  Recent Lipid Panel    Component Value Date/Time   CHOL 62 (L) 10/29/2021 0910   TRIG 97 10/29/2021 0910   HDL 43 10/29/2021 0910   CHOLHDL 5.9 08/26/2021 0027   VLDL 19 08/26/2021 0027   LDLCALC 0 10/29/2021 0910     Risk Assessment/Calculations:               Physical Exam:    VS:  BP 96/64   Pulse 89   Ht '5\' 2"'$  (1.575 m)   Wt 103 lb (46.7 kg)   SpO2 99%   BMI 18.84 kg/m     Wt Readings from Last 3 Encounters:  03/18/22 103 lb (46.7 kg)  10/27/21 96 lb 9.6 oz (43.8 kg)  09/25/21 95 lb (43.1 kg)     GEN: Thin well developed in no acute distress HEENT: Normal NECK: No JVD; No carotid bruits LYMPHATICS: No lymphadenopathy CARDIAC: RRR, no murmurs, rubs, gallops RESPIRATORY:  Clear to auscultation without rales, wheezing or rhonchi  ABDOMEN: Soft, non-tender, non-distended MUSCULOSKELETAL:  No edema; No deformity  SKIN: Warm and dry NEUROLOGIC:  Alert and oriented x 3 PSYCHIATRIC:  Normal affect   ASSESSMENT:    1. Shortness of breath    PLAN:    In order of problems listed above:  Coronary artery disease post anterior MI on 08/26/2021, LAD stenting/thrombectomy with ischemic cardiomyopathy EF 40 to 45%, returned to normal EF 60 to 65% - On dual antiplatelet therapy with Brilinta and aspirin, now changed to Plavix and aspirin secondary to perceived side  effects of Brilinta.  No evidence of bleeding. -On low-dose metoprolol XL 12.5 mg daily. -On Repatha with excellent LDL numbers.  Crestor was stopped thought to be secondary to itching.  Study - EVOLVE-MI - evolocumab (REPATHA) 140 mg/mL SQ injection (PI-Dr. Lia Foyer), Inject 1 mL (140 mg total) into the skin every 14 (fourteen) days. For Investigational Use Only. Inject subcutaneously into abdomen, thigh, or upper arm every 14 days. Rotate injection sites and do not inject into areas where skin is tender, bruised, or red. Please contact [facility]  for any questions or concerns regarding this medication.   Former smoker -quit after MI 2023. Many years.   SOB -ECHO.  Want to make sure that EF has remained normal.  If normal, we will have low threshold for pulmonary evaluation given her longstanding history of smoking.  She may have underlying COPD.  Hair loss -stop Toprol 12.5.  I think it is reasonable to due so given the chronicity following MI.  Consider biotin supplement.  Memory impairment - If this continues despite recent changes, consider neurology evaluation.          Medication Adjustments/Labs and Tests Ordered: Current medicines are reviewed at length with the patient today.  Concerns regarding medicines are outlined above.  Orders Placed This Encounter  Procedures   ECHOCARDIOGRAM COMPLETE   No orders of the defined types were placed in this encounter.   Patient Instructions  Medication Instructions:  Please discontinue your Metoprolol. Continue all other medications as listed.  Biotin - hair supplement   *If you need a refill on your cardiac medications before your next appointment, please call your pharmacy*  Testing/Procedures: Your physician has requested that you have an echocardiogram. Echocardiography is a painless test that uses sound waves to create images of your heart. It provides your doctor with information about  the size and shape of your heart and how well your heart's chambers and valves are working. This procedure takes approximately one hour. There are no restrictions for this procedure. Please do NOT wear cologne, perfume, aftershave, or lotions (deodorant is allowed). Please arrive 15 minutes prior to your appointment time.   Follow-Up: At [facility], you and your health needs are our priority.  As part of our continuing mission to provide you with exceptional heart care, we have created designated Provider Care Teams.  These Care Teams include your primary Cardiologist (physician) and Advanced Practice Providers (APPs -  Physician Assistants and Nurse Practitioners) who all work together to provide you with the care you need, when you need it.  We recommend signing up for the patient portal called "MyChart".  Sign up information is provided on this After Visit Summary.  MyChart is used to connect with patients for Virtual Visits (Telemedicine).  Patients are able to view lab/test results, encounter notes, upcoming appointments, etc.  Non-urgent messages can be sent to your provider as well.   To learn more about what you can do with MyChart, go to [email or web address].    Your next appointment:   3 month(s)  Provider:   Nicholes Rough, PA-C, Ambrose Pancoast, NP, Ermalinda Barrios, PA-C, Christen Bame, NP, or Richardson Dopp, PA-C            Signed, Candee Furbish, MD  03/18/2022 9:36 AM    [place]

## 2022-03-18 NOTE — Patient Instructions (Signed)
Medication Instructions:  Please discontinue your Metoprolol. Continue all other medications as listed.  Biotin - hair supplement   *If you need a refill on your cardiac medications before your next appointment, please call your pharmacy*  Testing/Procedures: Your physician has requested that you have an echocardiogram. Echocardiography is a painless test that uses sound waves to create images of your heart. It provides your doctor with information about the size and shape of your heart and how well your heart's chambers and valves are working. This procedure takes approximately one hour. There are no restrictions for this procedure. Please do NOT wear cologne, perfume, aftershave, or lotions (deodorant is allowed). Please arrive 15 minutes prior to your appointment time.   Follow-Up: At [facility], you and your health needs are our priority.  As part of our continuing mission to provide you with exceptional heart care, we have created designated Provider Care Teams.  These Care Teams include your primary Cardiologist (physician) and Advanced Practice Providers (APPs -  Physician Assistants and Nurse Practitioners) who all work together to provide you with the care you need, when you need it.  We recommend signing up for the patient portal called "MyChart".  Sign up information is provided on this After Visit Summary.  MyChart is used to connect with patients for Virtual Visits (Telemedicine).  Patients are able to view lab/test results, encounter notes, upcoming appointments, etc.  Non-urgent messages can be sent to your provider as well.   To learn more about what you can do with MyChart, go to [email or web address].    Your next appointment:   3 month(s)  Provider:   Nicholes Rough, PA-C, Ambrose Pancoast, NP, Ermalinda Barrios, PA-C, Christen Bame, NP, or Richardson Dopp, PA-C

## 2022-04-12 ENCOUNTER — Ambulatory Visit (HOSPITAL_COMMUNITY): Payer: Medicare Other | Attending: Cardiology

## 2022-04-12 DIAGNOSIS — R0602 Shortness of breath: Secondary | ICD-10-CM | POA: Diagnosis not present

## 2022-04-12 LAB — ECHOCARDIOGRAM COMPLETE
Area-P 1/2: 5.34 cm2
P 1/2 time: 507 msec
S' Lateral: 2.4 cm

## 2022-04-23 ENCOUNTER — Telehealth: Payer: Self-pay

## 2022-04-23 NOTE — Telephone Encounter (Signed)
Patient presented at [facility] office and asked to be taken off her plavix. She states she feels it is making her short term memory worse. Patient last saw Dr. Marlou Porch on 03/18/22 at reported she was concerned about her memory at that time. Dr. Marlou Porch recommended neurology referral. Patient tells me she has not seen a neurologist but remains concerned about her memory. Patient is able to tell me her name, DOB, her doctor and family member's names. She is accurately able to recall her last visit with Dr. Marlou Porch and his recommendations. She demonstrates no garbled speech or hemispheric weakness, and is able to walk without any gait impairmetn. She states that she is concerned because "My mind goes blank a lot when I am trying to remember a long sequence of tasks." She states she wants to stop her plavix. I advised her not to do this without talking to an APP or MD as she has a stent. Her next appointment is in May, offered her an appointment for Friday, March 15 with Richardson Dopp. Patient accepting appointment and agrees to stay on her plavix til she talks to [place]. Forwarded to Dr. Marlou Porch and to patient's PCP per patient's request as she states she would like help with a neurology referral.  Provided education regarding stroke symptoms (one sided weakness, vision/gate/balance/speech disturbances) and advised her to report to ED if any of these symptoms presents at any t ime. Patient verbalizes understanding.
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
## 2022-04-30 ENCOUNTER — Ambulatory Visit: Payer: Medicare Other | Attending: Physician Assistant | Admitting: Physician Assistant

## 2022-04-30 ENCOUNTER — Encounter: Payer: Self-pay | Admitting: Physician Assistant

## 2022-04-30 VITALS — BP 91/60 | HR 86 | Ht 62.5 in | Wt 103.4 lb

## 2022-04-30 DIAGNOSIS — I2510 Atherosclerotic heart disease of native coronary artery without angina pectoris: Secondary | ICD-10-CM | POA: Diagnosis not present

## 2022-04-30 DIAGNOSIS — R413 Other amnesia: Secondary | ICD-10-CM | POA: Diagnosis not present

## 2022-04-30 DIAGNOSIS — E785 Hyperlipidemia, unspecified: Secondary | ICD-10-CM | POA: Diagnosis not present

## 2022-04-30 NOTE — Telephone Encounter (Signed)
See my OV note from 04/30/2022. Richardson Dopp, PA-C    04/30/2022 12:29 PM

## 2022-04-30 NOTE — Assessment & Plan Note (Signed)
S/p Ant STEMI in July 2023 tx with DES to mLAD. EF was 40-45 at that time but did improve to normal by echocardiogram in 03/2022. She was changed from Brilinta to Plavix due to side effects. She notes issues with memory loss and feels it is related to Plavix. I have explained that this is not likely. Dr. Marlou Porch has noted it would be ok to stop Plavix at this time since it has been > 6 mos since her PCI. I explained to her that there is a very slight increased risk if she goes to single antiplatelet Rx at this time. She is willing to accept that risk and stop Plavix.  DC Plavix Continue ASA 81 mg once daily - I explained to her the importance of this Continue Repatha 140 mg every 2 weeks F/u July 2024 with Dr. Marlou Porch

## 2022-04-30 NOTE — Assessment & Plan Note (Signed)
[place] labs reviewed. LDL was optimal at 18 on labs 11/06/21. Continue Repatha 140 mg every 2 weeks.

## 2022-04-30 NOTE — Patient Instructions (Signed)
Medication Instructions:  Your physician has recommended you make the following change in your medication:   STOP Plavix  *If you need a refill on your cardiac medications before your next appointment, please call your pharmacy*   Lab Work: None ordered  If you have labs (blood work) drawn today and your tests are completely normal, you will receive your results only by: [place] (if you have MyChart) OR A paper copy in the mail If you have any lab test that is abnormal or we need to change your treatment, we will call you to review the results.   Testing/Procedures: None ordered  You have been referred to [facility].  They will call you with an appointment.    Follow-Up: At [facility], you and your health needs are our priority.  As part of our continuing mission to provide you with exceptional heart care, we have created designated Provider Care Teams.  These Care Teams include your primary Cardiologist (physician) and Advanced Practice Providers (APPs -  Physician Assistants and Nurse Practitioners) who all work together to provide you with the care you need, when you need it.  We recommend signing up for the patient portal called "MyChart".  Sign up information is provided on this After Visit Summary.  MyChart is used to connect with patients for Virtual Visits (Telemedicine).  Patients are able to view lab/test results, encounter notes, upcoming appointments, etc.  Non-urgent messages can be sent to your provider as well.   To learn more about what you can do with MyChart, go to [email or web address].    Your next appointment:   4 month(s)  Provider:   Candee Furbish, MD     Other Instructions

## 2022-04-30 NOTE — Assessment & Plan Note (Signed)
Confusion is listed as a potential side effect with Plavix. However, this is not very likely. I have recommended that she have an evaluation with neurology to be complete. Refer to Neurology

## 2022-05-03 ENCOUNTER — Telehealth: Payer: Self-pay | Admitting: Cardiology
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
## 2022-05-03 NOTE — Telephone Encounter (Signed)
Pt c/o medication issue:  1. Name of Medication: clopidogrel (PLAVIX) 75 MG tablet   2. How are you currently taking this medication (dosage and times per day)? N/a  3. Are you having a reaction (difficulty breathing--STAT)? no  4. What is your medication issue? Pt states that at her visit on 3/15 with Richardson Dopp PA, he advised her to stop taking Plavix. Pt did not take the Plavix on Friday and Saturday. On Sunday, the pt states she didn't feel right, that she felt jittery. She states it hasn't quite been a year since her heart attack, and isn't sure how to proceed.

## 2022-05-03 NOTE — Telephone Encounter (Signed)
Called pt in regards to Plavix. Reports stopped taking Plavix on Friday after OV with Richardson Dopp, PA.  On Sunday developed SOB, difficulty catching breath, feeling shaky and jittery.  Restarted Plavix on Sunday around 5-6 pm.   Pt reports feels anxious.  Does not have a way to check BP/HR.  Reports feels like heart is racing.  No pain with a deep breath.  Denies CP.  Advised pt will send concern to Pharmacist to see if symptoms could be r/t Plavix.  Also advised pt if symptoms persist or are bothersome to go to ED for evaluation.

## 2022-05-03 NOTE — Telephone Encounter (Signed)
That does not sound related to discontinuing Plavix. Possible anxiety. Recommend f/u with PCP

## 2022-05-04 NOTE — Telephone Encounter (Signed)
Call placed to pt regarding message below.  Left pt a message to call back.

## 2022-05-04 NOTE — Telephone Encounter (Signed)
Patient is returning call. Requesting return call.  

## 2022-05-04 NOTE — Telephone Encounter (Signed)
Spoke wit pt.  She has already restarted the Plavix (I added it back to her med list), and after the 2nd dose, all of her symptoms went away.  Pt was thankful for all the help / assistance that she has received.

## 2022-05-04 NOTE — Telephone Encounter (Signed)
I agree her current symptoms are not due to stopping Plavix. Ideally she should remain on ASA and Plavix for a year after a heart attack. Resuming it is acceptable. I agree that it is most appropriate to see PCP for evaluation of current symptoms.  Richardson Dopp, PA-C    05/04/2022 1:49 PM

## 2022-05-05 ENCOUNTER — Encounter: Payer: Self-pay | Admitting: *Deleted

## 2022-05-05 DIAGNOSIS — Z006 Encounter for examination for normal comparison and control in clinical research program: Secondary | ICD-10-CM

## 2022-05-05 NOTE — Research (Signed)
Follow-Up Visit Completed*   []  Not Necessary, No Potential Adverse Events Or Medication Issues Reported On Completed Subject Questionnaire   [x]  Yes, Contact With Subject/Alternate Contact Completed   []  Yes, No Contact With Subject/Alternate Contact Completed, But Electronic Health Record Was Reviewed   []  No, Unable To Contact Subject/Alternate Contact   Have you reviewed Ongoing medications on the Targeted Concomitant Medication form and updated the form as needed?   [x]  Yes   []  No   Subject Status*   [x]  Continuing In Follow-up   []  At Risk For Lost To Follow-up   []  Withdrawal From All Future Study Activities Including Passive Follow-up By [place] Record Review Or Contact With Healthcare Provider Or Family Member/Friend   []  Death   Vital Status*   [x]  Alive   []  Deceased   []  Unknown   Last Known To Be Alive Source*   [x]  Subject Completed Follow-up Questionnaire/Seen In Person/Via Telephone Contact   []  Family Member or Caretaker   []  Primary Physician Or Medical Records   []  Publicly Available Source   []  Other  Date of last dose taken   22-Apr-2022  Over the last 12 weeks did the subject miss any doses? No   Over the last 12 weeks did the subject restart Evolocumab after an interruption? no

## 2022-05-06 ENCOUNTER — Encounter: Payer: Self-pay | Admitting: Physician Assistant

## 2022-05-11 ENCOUNTER — Ambulatory Visit: Payer: Medicare Other | Admitting: Physician Assistant

## 2022-05-11 ENCOUNTER — Other Ambulatory Visit (INDEPENDENT_AMBULATORY_CARE_PROVIDER_SITE_OTHER): Payer: Medicare Other

## 2022-05-11 ENCOUNTER — Encounter: Payer: Self-pay | Admitting: Physician Assistant

## 2022-05-11 VITALS — BP 102/70 | HR 82 | Ht 62.5 in | Wt 106.0 lb

## 2022-05-11 DIAGNOSIS — R413 Other amnesia: Secondary | ICD-10-CM | POA: Diagnosis not present

## 2022-05-11 LAB — TSH: TSH: 1 u[IU]/mL (ref 0.35–5.50)

## 2022-05-11 LAB — FOLATE: Folate: 4.5 ng/mL — ABNORMAL LOW (ref >5.9)

## 2022-05-11 LAB — VITAMIN B12: Vitamin B-12: 161 pg/mL — ABNORMAL LOW (ref 211–911)
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
## 2022-05-11 NOTE — Patient Instructions (Signed)
It was a pleasure to see you today at our office.    MRI of the brain, the radiology office will call you to arrange you appointment Check labs today Follow up in 1 month  Whom to call:  Memory  decline, memory medications: Call our office [phone]   For psychiatric meds, mood meds: Please have your primary care physician manage these medications.     For assessment of decision of mental capacity and competency:  Call Dr. Anthoney Harada, geriatric psychiatrist at [phone]  For guidance in geriatric dementia issues please call [facility] [phone]    If you have any severe symptoms of a stroke, or other severe issues such as confusion,severe chills or fever, etc call 911 or go to the ER as you may need to be evaluated further         RECOMMENDATIONS FOR ALL PATIENTS WITH MEMORY PROBLEMS: 1. Continue to exercise (Recommend 30 minutes of walking everyday, or 3 hours every week) 2. Increase social interactions - continue going to [place] and enjoy social gatherings with friends and family 3. Eat healthy, avoid fried foods and eat more fruits and vegetables 4. Maintain adequate blood pressure, blood sugar, and blood cholesterol level. Reducing the risk of stroke and cardiovascular disease also helps promoting better memory. 5. Avoid stressful situations. Live a simple life and avoid aggravations. Organize your time and prepare for the next day in anticipation. 6. Sleep well, avoid any interruptions of sleep and avoid any distractions in the bedroom that may interfere with adequate sleep quality 7. Avoid sugar, avoid sweets as there is a strong link between excessive sugar intake, diabetes, and cognitive impairment We discussed the Mediterranean diet, which has been shown to help patients reduce the risk of progressive memory disorders and reduces cardiovascular risk. This includes eating fish, eat fruits and green leafy vegetables, nuts like almonds and hazelnuts,  walnuts, and also use olive oil. Avoid fast foods and fried foods as much as possible. Avoid sweets and sugar as sugar use has been linked to worsening of memory function.  There is always a concern of gradual progression of memory problems. If this is the case, then we may need to adjust level of care according to patient needs. Support, both to the patient and caregiver, should then be put into place.      FALL PRECAUTIONS: Be cautious when walking. Scan the area for obstacles that may increase the risk of trips and falls. When getting up in the mornings, sit up at the edge of the bed for a few minutes before getting out of bed. Consider elevating the bed at the head end to avoid drop of blood pressure when getting up. Walk always in a well-lit room (use night lights in the walls). Avoid area rugs or power cords from appliances in the middle of the walkways. Use a walker or a cane if necessary and consider physical therapy for balance exercise. Get your eyesight checked regularly.  FINANCIAL OVERSIGHT: Supervision, especially oversight when making financial decisions or transactions is also recommended.  HOME SAFETY: Consider the safety of the kitchen when operating appliances like stoves, microwave oven, and blender. Consider having supervision and share cooking responsibilities until no longer able to participate in those. Accidents with firearms and other hazards in the house should be identified and addressed as well.   ABILITY TO BE LEFT ALONE: If patient is unable to contact 911 operator, consider using LifeLine, or when the need is there, arrange for  someone to stay with patients. Smoking is a fire hazard, consider supervision or cessation. Risk of wandering should be assessed by caregiver and if detected at any point, supervision and safe proof recommendations should be instituted.  MEDICATION SUPERVISION: Inability to self-administer medication needs to be constantly addressed. Implement a  mechanism to ensure safe administration of the medications.   DRIVING: Regarding driving, in patients with progressive memory problems, driving will be impaired. We advise to have someone else do the driving if trouble finding directions or if minor accidents are reported. Independent driving assessment is available to determine safety of driving.   If you are interested in the driving assessment, you can contact the following:  [employer] in [place]  [place] [phone] or [phone]    [place] refers to food and lifestyle choices that are based on the traditions of countries located on the [employer]. This way of eating has been shown to help prevent certain conditions and improve outcomes for people who have chronic diseases, like kidney disease and heart disease. What are tips for following this plan? Lifestyle  Cook and eat meals together with your family, when possible. Drink enough fluid to keep your urine clear or pale yellow. Be physically active every day. This includes: Aerobic exercise like running or swimming. Leisure activities like gardening, walking, or housework. Get 7-8 hours of sleep each night. If recommended by your health care provider, drink red wine in moderation. This means 1 glass a day for nonpregnant women and 2 glasses a day for men. A glass of wine equals 5 oz (150 mL). Reading food labels  Check the serving size of packaged foods. For foods such as rice and pasta, the serving size refers to the amount of cooked product, not dry. Check the total fat in packaged foods. Avoid foods that have saturated fat or trans fats. Check the ingredients list for added sugars, such as corn syrup. Shopping  At the grocery store, buy most of your food from the areas near the walls of the store. This  includes: Fresh fruits and vegetables (produce). Grains, beans, nuts, and seeds. Some of these may be available in unpackaged forms or large amounts (in bulk). Fresh seafood. Poultry and eggs. Low-fat dairy products. Buy whole ingredients instead of prepackaged foods. Buy fresh fruits and vegetables in-season from local farmers markets. Buy frozen fruits and vegetables in resealable bags. If you do not have access to quality fresh seafood, buy precooked frozen shrimp or canned fish, such as tuna, salmon, or sardines. Buy small amounts of raw or cooked vegetables, salads, or olives from the deli or salad bar at your store. Stock your pantry so you always have certain foods on hand, such as olive oil, canned tuna, canned tomatoes, rice, pasta, and beans. Cooking  Cook foods with extra-virgin olive oil instead of using butter or other vegetable oils. Have meat as a side dish, and have vegetables or grains as your main dish. This means having meat in small portions or adding small amounts of meat to foods like pasta or stew. Use beans or vegetables instead of meat in common dishes like chili or lasagna. Experiment with different cooking methods. Try roasting or broiling vegetables instead of steaming or sauteing them. Add frozen vegetables to soups, stews, pasta, or rice. Add nuts or seeds for added healthy fat at each meal. You can add these to yogurt, salads,  or vegetable dishes. Marinate fish or vegetables using olive oil, lemon juice, garlic, and fresh herbs. Meal planning  Plan to eat 1 vegetarian meal one day each week. Try to work up to 2 vegetarian meals, if possible. Eat seafood 2 or more times a week. Have healthy snacks readily available, such as: Vegetable sticks with hummus. Greek yogurt. Fruit and nut trail mix. Eat balanced meals throughout the week. This includes: Fruit: 2-3 servings a day Vegetables: 4-5 servings a day Low-fat dairy: 2 servings a day Fish, poultry, or  lean meat: 1 serving a day Beans and legumes: 2 or more servings a week Nuts and seeds: 1-2 servings a day Whole grains: 6-8 servings a day Extra-virgin olive oil: 3-4 servings a day Limit red meat and sweets to only a few servings a month What are my food choices? Mediterranean diet Recommended Grains: Whole-grain pasta. Brown rice. Bulgar wheat. Polenta. Couscous. Whole-wheat bread. [place]. Vegetables: Artichokes. Beets. Broccoli. Cabbage. Carrots. Eggplant. Green beans. Chard. Kale. Spinach. Onions. Leeks. Peas. Squash. Tomatoes. Peppers. Radishes. Fruits: Apples. Apricots. Avocado. Berries. Bananas. Cherries. Dates. Figs. Grapes. Lemons. Melon. Oranges. Peaches. Plums. Pomegranate. Meats and other protein foods: Beans. Almonds. Sunflower seeds. Pine nuts. Peanuts. [place]. Salmon. Scallops. Shrimp. [place]. Tilapia. Clams. Oysters. Eggs. Dairy: Low-fat milk. Cheese. Greek yogurt. Beverages: Water. Red wine. Herbal tea. Fats and oils: Extra virgin olive oil. Avocado oil. Grape seed oil. Sweets and desserts: [place] yogurt with honey. Baked apples. Poached pears. Trail mix. Seasoning and other foods: Basil. Cilantro. Coriander. Cumin. Mint. Parsley. Sage. Rosemary. Tarragon. Garlic. Oregano. Thyme. Pepper. Balsalmic vinegar. Tahini. Hummus. Tomato sauce. Olives. Mushrooms. Limit these Grains: Prepackaged pasta or rice dishes. Prepackaged cereal with added sugar. Vegetables: Deep fried potatoes (french fries). Fruits: Fruit canned in syrup. Meats and other protein foods: Beef. Pork. Lamb. Poultry with skin. Hot dogs. Bernie[place] Salines. Dairy: Ice cream. Sour cream. Whole milk. Beverages: Juice. Sugar-sweetened soft drinks. Beer. Liquor and spirits. Fats and oils: Butter. Canola oil. Vegetable oil. Beef fat (tallow). Lard. Sweets and desserts: Cookies. Cakes. Pies. Candy. Seasoning and other foods: Mayonnaise. Premade sauces and marinades. The items listed may not be a complete list. Talk with your  dietitian about what dietary choices are right for you. Summary The Mediterranean diet includes both food and lifestyle choices. Eat a variety of fresh fruits and vegetables, beans, nuts, seeds, and whole grains. Limit the amount of red meat and sweets that you eat. Talk with your health care provider about whether it is safe for you to drink red wine in moderation. This means 1 glass a day for nonpregnant women and 2 glasses a day for men. A glass of wine equals 5 oz (150 mL). This information is not intended to replace advice given to you by your health care provider. Make sure you discuss any questions you have with your health care provider. Document Released: 09/25/2015 Document Revised: 10/28/2015 Document Reviewed: 09/25/2015 Elsevier Interactive Patient Education  2017 [employer].

## 2022-05-17 ENCOUNTER — Telehealth: Payer: Self-pay

## 2022-05-17 NOTE — Telephone Encounter (Signed)
Called to check on pt and how she is tolerating the B12

## 2022-05-19 DIAGNOSIS — H5213 Myopia, bilateral: Secondary | ICD-10-CM | POA: Diagnosis not present

## 2022-05-19 DIAGNOSIS — H35373 Puckering of macula, bilateral: Secondary | ICD-10-CM | POA: Diagnosis not present

## 2022-05-19 DIAGNOSIS — H2513 Age-related nuclear cataract, bilateral: Secondary | ICD-10-CM | POA: Diagnosis not present

## 2022-05-21 ENCOUNTER — Telehealth: Payer: Self-pay | Admitting: Anesthesiology

## 2022-05-21 ENCOUNTER — Other Ambulatory Visit: Payer: Self-pay | Admitting: Neurology

## 2022-05-21 DIAGNOSIS — F419 Anxiety disorder, unspecified: Secondary | ICD-10-CM

## 2022-05-21 MED ORDER — LORAZEPAM 1 MG PO TABS: 1.0000 mg | ORAL_TABLET | ORAL | 0 refills | Status: AC | PRN

## 2022-05-21 NOTE — Telephone Encounter (Signed)
Patient advised of Dr.Hilll note, Will order ativan 1 mg with 2 tablets. Can take one prior to going to MRI and repeat at MRI if needed. She should be told to have a driver if she is going to take these medications as they will make her tired.

## 2022-05-21 NOTE — Telephone Encounter (Signed)
Pt left message with AN stating she would like to get a Rx for a medication to help her calm her nerves during her MRI. 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 States she is not allergic to Xanax or Valium. Rx can be sent to [facility] on [address], [place].

## 2022-05-26 ENCOUNTER — Ambulatory Visit
Admission: RE | Admit: 2022-05-26 | Discharge: 2022-05-26 | Disposition: A | Discharge status: Home / Self Care | Payer: Medicare Other | Source: Ambulatory Visit | Attending: Physician Assistant | Admitting: Physician Assistant

## 2022-05-26 DIAGNOSIS — R413 Other amnesia: Secondary | ICD-10-CM

## 2022-05-26 DIAGNOSIS — G319 Degenerative disease of nervous system, unspecified: Secondary | ICD-10-CM | POA: Diagnosis not present

## 2022-05-26 DIAGNOSIS — I6782 Cerebral ischemia: Secondary | ICD-10-CM | POA: Diagnosis not present

## 2022-05-27 NOTE — Progress Notes (Signed)
Patient advised of Results.

## 2022-06-03 DIAGNOSIS — H43823 Vitreomacular adhesion, bilateral: Secondary | ICD-10-CM | POA: Diagnosis not present

## 2022-06-03 DIAGNOSIS — H31091 Other chorioretinal scars, right eye: Secondary | ICD-10-CM | POA: Diagnosis not present

## 2022-06-03 DIAGNOSIS — H2513 Age-related nuclear cataract, bilateral: Secondary | ICD-10-CM | POA: Diagnosis not present

## 2022-06-14 DIAGNOSIS — H269 Unspecified cataract: Secondary | ICD-10-CM | POA: Diagnosis not present

## 2022-06-14 DIAGNOSIS — H2512 Age-related nuclear cataract, left eye: Secondary | ICD-10-CM | POA: Diagnosis not present

## 2022-06-17 ENCOUNTER — Ambulatory Visit: Payer: Medicare Other | Admitting: Physician Assistant

## 2022-06-17 ENCOUNTER — Ambulatory Visit: Payer: Medicare Other | Admitting: Nurse Practitioner

## 2022-06-24 ENCOUNTER — Encounter: Payer: Self-pay | Admitting: Physician Assistant

## 2022-06-24 ENCOUNTER — Ambulatory Visit: Payer: Medicare Other | Admitting: Physician Assistant

## 2022-06-24 VITALS — BP 98/54 | HR 79 | Resp 20 | Ht 62.5 in | Wt 106.0 lb

## 2022-06-24 DIAGNOSIS — R413 Other amnesia: Secondary | ICD-10-CM | POA: Diagnosis not present

## 2022-06-24 MED ORDER — DONEPEZIL HCL 5 MG PO TABS: 5.0000 mg | ORAL_TABLET | Freq: Every morning | ORAL | 11 refills | Status: DC

## 2022-06-24 NOTE — Patient Instructions (Signed)
It was a pleasure to see you today at our office.    Follow up in 6 months Neurocognitive testing  Take B12 and Folic acid supplements We will start donepezil 5 mg daily.    Whom to call:  Memory  decline, memory medications: Call our office [phone]   For psychiatric meds, mood meds: Please have your primary care physician manage these medications.     For assessment of decision of mental capacity and competency:  Call Dr. Erick Blinks, geriatric psychiatrist at [phone]  For guidance in geriatric dementia issues please call [facility] [phone]    If you have any severe symptoms of a stroke, or other severe issues such as confusion,severe chills or fever, etc call 911 or go to the ER as you may need to be evaluated further         RECOMMENDATIONS FOR ALL PATIENTS WITH MEMORY PROBLEMS: 1. Continue to exercise (Recommend 30 minutes of walking everyday, or 3 hours every week) 2. Increase social interactions - continue going to [place] and enjoy social gatherings with friends and family 3. Eat healthy, avoid fried foods and eat more fruits and vegetables 4. Maintain adequate blood pressure, blood sugar, and blood cholesterol level. Reducing the risk of stroke and cardiovascular disease also helps promoting better memory. 5. Avoid stressful situations. Live a simple life and avoid aggravations. Organize your time and prepare for the next day in anticipation. 6. Sleep well, avoid any interruptions of sleep and avoid any distractions in the bedroom that may interfere with adequate sleep quality 7. Avoid sugar, avoid sweets as there is a strong link between excessive sugar intake, diabetes, and cognitive impairment We discussed the Mediterranean diet, which has been shown to help patients reduce the risk of progressive memory disorders and reduces cardiovascular risk. This includes eating fish, eat fruits and green leafy vegetables, nuts like almonds and  hazelnuts, walnuts, and also use olive oil. Avoid fast foods and fried foods as much as possible. Avoid sweets and sugar as sugar use has been linked to worsening of memory function.  There is always a concern of gradual progression of memory problems. If this is the case, then we may need to adjust level of care according to patient needs. Support, both to the patient and caregiver, should then be put into place.      FALL PRECAUTIONS: Be cautious when walking. Scan the area for obstacles that may increase the risk of trips and falls. When getting up in the mornings, sit up at the edge of the bed for a few minutes before getting out of bed. Consider elevating the bed at the head end to avoid drop of blood pressure when getting up. Walk always in a well-lit room (use night lights in the walls). Avoid area rugs or power cords from appliances in the middle of the walkways. Use a walker or a cane if necessary and consider physical therapy for balance exercise. Get your eyesight checked regularly.  FINANCIAL OVERSIGHT: Supervision, especially oversight when making financial decisions or transactions is also recommended.  HOME SAFETY: Consider the safety of the kitchen when operating appliances like stoves, microwave oven, and blender. Consider having supervision and share cooking responsibilities until no longer able to participate in those. Accidents with firearms and other hazards in the house should be identified and addressed as well.   ABILITY TO BE LEFT ALONE: If patient is unable to contact 911 operator, consider using LifeLine, or when the need is there, arrange  for someone to stay with patients. Smoking is a fire hazard, consider supervision or cessation. Risk of wandering should be assessed by caregiver and if detected at any point, supervision and safe proof recommendations should be instituted.  MEDICATION SUPERVISION: Inability to self-administer medication needs to be constantly addressed.  Implement a mechanism to ensure safe administration of the medications.   DRIVING: Regarding driving, in patients with progressive memory problems, driving will be impaired. We advise to have someone else do the driving if trouble finding directions or if minor accidents are reported. Independent driving assessment is available to determine safety of driving.   If you are interested in the driving assessment, you can contact the following:  [employer] in [place] [phone]  [facility] [phone]  [facility] [phone] or [phone]    Mediterranean Diet A Mediterranean diet refers to food and lifestyle choices that are based on the traditions of countries located on the [employer]. This way of eating has been shown to help prevent certain conditions and improve outcomes for people who have chronic diseases, like kidney disease and heart disease. What are tips for following this plan? Lifestyle  Cook and eat meals together with your family, when possible. Drink enough fluid to keep your urine clear or pale yellow. Be physically active every day. This includes: Aerobic exercise like running or swimming. Leisure activities like gardening, walking, or housework. Get 7-8 hours of sleep each night. If recommended by your health care provider, drink red wine in moderation. This means 1 glass a day for nonpregnant women and 2 glasses a day for men. A glass of wine equals 5 oz (150 mL). Reading food labels  Check the serving size of packaged foods. For foods such as rice and pasta, the serving size refers to the amount of cooked product, not dry. Check the total fat in packaged foods. Avoid foods that have saturated fat or trans fats. Check the ingredients list for added sugars, such as corn syrup. Shopping  At the grocery store, buy most of your food from the areas near the walls of the store. This  includes: Fresh fruits and vegetables (produce). Grains, beans, nuts, and seeds. Some of these may be available in unpackaged forms or large amounts (in bulk). Fresh seafood. Poultry and eggs. Low-fat dairy products. Buy whole ingredients instead of prepackaged foods. Buy fresh fruits and vegetables in-season from local farmers markets. Buy frozen fruits and vegetables in resealable bags. If you do not have access to quality fresh seafood, buy precooked frozen shrimp or canned fish, such as tuna, salmon, or sardines. Buy small amounts of raw or cooked vegetables, salads, or olives from the deli or salad bar at your store. Stock your pantry so you always have certain foods on hand, such as olive oil, canned tuna, canned tomatoes, rice, pasta, and beans. Cooking  Cook foods with extra-virgin olive oil instead of using butter or other vegetable oils. Have meat as a side dish, and have vegetables or grains as your main dish. This means having meat in small portions or adding small amounts of meat to foods like pasta or stew. Use beans or vegetables instead of meat in common dishes like chili or lasagna. Experiment with different cooking methods. Try roasting or broiling vegetables instead of steaming or sauteing them. Add frozen vegetables to soups, stews, pasta, or rice. Add nuts or seeds for added healthy fat at each meal. You can add these to yogurt,  salads, or vegetable dishes. Marinate fish or vegetables using olive oil, lemon juice, garlic, and fresh herbs. Meal planning  Plan to eat 1 vegetarian meal one day each week. Try to work up to 2 vegetarian meals, if possible. Eat seafood 2 or more times a week. Have healthy snacks readily available, such as: Vegetable sticks with hummus. Greek yogurt. Fruit and nut trail mix. Eat balanced meals throughout the week. This includes: Fruit: 2-3 servings a day Vegetables: 4-5 servings a day Low-fat dairy: 2 servings a day Fish, poultry, or  lean meat: 1 serving a day Beans and legumes: 2 or more servings a week Nuts and seeds: 1-2 servings a day Whole grains: 6-8 servings a day Extra-virgin olive oil: 3-4 servings a day Limit red meat and sweets to only a few servings a month What are my food choices? Mediterranean diet Recommended Grains: Whole-grain pasta. Brown rice. Bulgar wheat. Polenta. Couscous. Whole-wheat bread. [place]. Vegetables: Artichokes. Beets. Broccoli. Cabbage. Carrots. Eggplant. Green beans. Chard. Kale. Spinach. Onions. Leeks. Peas. Squash. Tomatoes. Peppers. Radishes. Fruits: Apples. Apricots. Avocado. Berries. Bananas. Cherries. Dates. Figs. Grapes. Lemons. Melon. Oranges. Peaches. Plums. Pomegranate. Meats and other protein foods: Beans. Almonds. Sunflower seeds. Pine nuts. Peanuts. Cod. Salmon. Scallops. Shrimp. Tuna. Tilapia. Clams. Oysters. Eggs. Dairy: Low-fat milk. Cheese. Greek yogurt. Beverages: Water. Red wine. Herbal tea. Fats and oils: Extra virgin olive oil. Avocado oil. Grape seed oil. Sweets and desserts: [place] yogurt with honey. Baked apples. Poached pears. Trail mix. Seasoning and other foods: Basil. Cilantro. Coriander. Cumin. Mint. Parsley. Sage. Rosemary. Tarragon. Garlic. Oregano. Thyme. Pepper. Balsalmic vinegar. Tahini. Hummus. Tomato sauce. Olives. Mushrooms. Limit these Grains: Prepackaged pasta or rice dishes. Prepackaged cereal with added sugar. Vegetables: Deep fried potatoes (french fries). Fruits: Fruit canned in syrup. Meats and other protein foods: Beef. Pork. Lamb. Poultry with skin. Hot dogs. [place]. Dairy: Ice cream. Sour cream. Whole milk. Beverages: Juice. Sugar-sweetened soft drinks. Beer. Liquor and spirits. Fats and oils: Butter. Canola oil. Vegetable oil. Beef fat (tallow). Lard. Sweets and desserts: Cookies. Cakes. Pies. Candy. Seasoning and other foods: Mayonnaise. Premade sauces and marinades. The items listed may not be a complete list. Talk with your  dietitian about what dietary choices are right for you. Summary The Mediterranean diet includes both food and lifestyle choices. Eat a variety of fresh fruits and vegetables, beans, nuts, seeds, and whole grains. Limit the amount of red meat and sweets that you eat. Talk with your health care provider about whether it is safe for you to drink red wine in moderation. This means 1 glass a day for nonpregnant women and 2 glasses a day for men. A glass of wine equals 5 oz (150 mL). This information is not intended to replace advice given to you by your health care provider. Make sure you discuss any questions you have with your health care provider. Document Released: 09/25/2015 Document Revised: 10/28/2015 Document Reviewed: 09/25/2015 Elsevier Interactive Patient Education  2017 [employer].
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
## 2022-06-28 DIAGNOSIS — H25811 Combined forms of age-related cataract, right eye: Secondary | ICD-10-CM | POA: Diagnosis not present

## 2022-06-28 DIAGNOSIS — H2511 Age-related nuclear cataract, right eye: Secondary | ICD-10-CM | POA: Diagnosis not present

## 2022-07-05 ENCOUNTER — Telehealth: Payer: Self-pay | Admitting: Physician Assistant

## 2022-07-05 NOTE — Telephone Encounter (Signed)
Shaking with Donepezil, no diarrhea, no nausea. I can't sleep, not able to take. Taken for 3 days, not doing well with it

## 2022-07-05 NOTE — Telephone Encounter (Signed)
Pt called in and left a message. She stated Huntley Dec gave her donepezil and it is making her sick. She says she is jumping all over on the inside.

## 2022-07-06 NOTE — Telephone Encounter (Signed)
Left detailed message to hold medicine.

## 2022-07-13 DIAGNOSIS — E538 Deficiency of other specified B group vitamins: Secondary | ICD-10-CM | POA: Diagnosis not present

## 2022-07-13 DIAGNOSIS — E7800 Pure hypercholesterolemia, unspecified: Secondary | ICD-10-CM | POA: Diagnosis not present

## 2022-07-13 DIAGNOSIS — Z9181 History of falling: Secondary | ICD-10-CM | POA: Diagnosis not present

## 2022-07-13 DIAGNOSIS — I2510 Atherosclerotic heart disease of native coronary artery without angina pectoris: Secondary | ICD-10-CM | POA: Diagnosis not present

## 2022-07-13 DIAGNOSIS — D500 Iron deficiency anemia secondary to blood loss (chronic): Secondary | ICD-10-CM | POA: Diagnosis not present

## 2022-07-20 DIAGNOSIS — R450 Nervousness: Secondary | ICD-10-CM | POA: Diagnosis not present

## 2022-07-20 DIAGNOSIS — R413 Other amnesia: Secondary | ICD-10-CM | POA: Diagnosis not present

## 2022-07-30 ENCOUNTER — Other Ambulatory Visit (HOSPITAL_COMMUNITY): Payer: Self-pay

## 2022-08-03 ENCOUNTER — Other Ambulatory Visit: Payer: Self-pay | Admitting: Physician Assistant

## 2022-08-05 ENCOUNTER — Other Ambulatory Visit (HOSPITAL_COMMUNITY): Payer: Self-pay

## 2022-08-11 ENCOUNTER — Other Ambulatory Visit (HOSPITAL_COMMUNITY): Payer: Self-pay

## 2022-08-12 DIAGNOSIS — H31091 Other chorioretinal scars, right eye: Secondary | ICD-10-CM | POA: Diagnosis not present

## 2022-08-12 DIAGNOSIS — H43823 Vitreomacular adhesion, bilateral: Secondary | ICD-10-CM | POA: Diagnosis not present

## 2022-08-17 ENCOUNTER — Other Ambulatory Visit (HOSPITAL_COMMUNITY): Payer: Self-pay

## 2022-08-17 ENCOUNTER — Other Ambulatory Visit: Payer: Self-pay

## 2022-08-17 MED ORDER — REPATHA SURECLICK 140 MG/ML ~~LOC~~ SOAJ
140.0000 mg | SUBCUTANEOUS | 2 refills | Status: DC
  Filled 2022-08-17: qty 2, 28d supply, fill #0

## 2022-08-18 ENCOUNTER — Telehealth: Payer: Self-pay | Admitting: *Deleted

## 2022-08-18 DIAGNOSIS — E538 Deficiency of other specified B group vitamins: Secondary | ICD-10-CM | POA: Diagnosis not present

## 2022-08-18 NOTE — Telephone Encounter (Signed)
Called patient for her 48 week phone call for the EVOLVE study and to see if she received her medication from the pharmacy. Left message for patient to call me back.      Leslie Cross, Research Coordinator 08/18/2022

## 2022-08-20 ENCOUNTER — Encounter: Payer: Self-pay | Admitting: *Deleted

## 2022-08-20 ENCOUNTER — Other Ambulatory Visit (HOSPITAL_COMMUNITY): Payer: Self-pay

## 2022-08-20 ENCOUNTER — Other Ambulatory Visit: Payer: Self-pay

## 2022-08-20 DIAGNOSIS — Z006 Encounter for examination for normal comparison and control in clinical research program: Secondary | ICD-10-CM

## 2022-08-20 NOTE — Research (Signed)
48 Week Follow-Up Visit Completed*   []  Not Necessary, No Potential Adverse Events Or Medication Issues Reported On Completed Subject Questionnaire   [x]  Yes, Contact With Subject/Alternate Contact Completed   []  Yes, No Contact With Subject/Alternate Contact Completed, But Electronic Health Record Was Reviewed   []  No, Unable To Contact Subject/Alternate Contact   Have you reviewed Ongoing medications on the Targeted Concomitant Medication form and updated the form as needed?   [x]  Yes   []  No   Subject Status*   [x]  Continuing In Follow-up   []  At Risk For Lost To Follow-up   []  Withdrawal From All Future Study Activities Including Passive Follow-up By Electronic Health Record Review Or Contact With Healthcare Provider Or Family Member/Friend   []  Death   Vital Status*   [x]  Alive   []  Deceased   []  Unknown   Last Known To Be Alive Source*   [x]  Subject Completed Follow-up Questionnaire/Seen In Person/Via Telephone Contact   []  Family Member or Caretaker   []  Primary Physician Or Medical Records   []  Publicly Available Source   []  Other  Date of last dose taken   12-Aug-2022  Over the last 12 weeks did the subject miss any doses? No   Over the last 12 weeks did the subject restart Evolocumab after an interruption? No

## 2022-08-25 DIAGNOSIS — M25531 Pain in right wrist: Secondary | ICD-10-CM | POA: Diagnosis not present

## 2022-09-13 ENCOUNTER — Ambulatory Visit: Payer: Medicare Other | Attending: Cardiology | Admitting: Cardiology

## 2022-09-13 ENCOUNTER — Encounter: Payer: Self-pay | Admitting: Cardiology

## 2022-09-13 VITALS — BP 102/58 | HR 75 | Ht 62.5 in | Wt 107.4 lb

## 2022-09-13 DIAGNOSIS — I2510 Atherosclerotic heart disease of native coronary artery without angina pectoris: Secondary | ICD-10-CM

## 2022-09-13 DIAGNOSIS — R413 Other amnesia: Secondary | ICD-10-CM | POA: Diagnosis not present

## 2022-09-13 DIAGNOSIS — I252 Old myocardial infarction: Secondary | ICD-10-CM | POA: Diagnosis not present

## 2022-09-13 DIAGNOSIS — E785 Hyperlipidemia, unspecified: Secondary | ICD-10-CM
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
## 2022-09-13 NOTE — Patient Instructions (Signed)
Medication Instructions:  The current medical regimen is effective;  continue present plan and medications.  *If you need a refill on your cardiac medications before your next appointment, please call your pharmacy*  Follow-Up: At [facility], you and your health needs are our priority.  As part of our continuing mission to provide you with exceptional heart care, we have created designated Provider Care Teams.  These Care Teams include your primary Cardiologist (physician) and Advanced Practice Providers (APPs -  Physician Assistants and Nurse Practitioners) who all work together to provide you with the care you need, when you need it.  We recommend signing up for the patient portal called "MyChart".  Sign up information is provided on this After Visit Summary.  MyChart is used to connect with patients for Virtual Visits (Telemedicine).  Patients are able to view lab/test results, encounter notes, upcoming appointments, etc.  Non-urgent messages can be sent to your provider as well.   To learn more about what you can do with MyChart, go to [email or web address].    Your next appointment:   1 year(s)  Provider:   NP/PA or Dr Anne Fu

## 2022-10-20 NOTE — Telephone Encounter (Signed)
This encounter was created in error - please disregard.

## 2022-10-28 ENCOUNTER — Telehealth: Payer: Self-pay | Admitting: Cardiology

## 2022-10-28 MED ORDER — CLOPIDOGREL BISULFATE 75 MG PO TABS: 75.0000 mg | ORAL_TABLET | Freq: Every day | ORAL | 3 refills | Status: DC

## 2022-10-28 NOTE — Telephone Encounter (Signed)
Pt's medication was sent to pt's pharmacy as requested. Confirmation received.  °

## 2022-10-28 NOTE — Telephone Encounter (Signed)
*  STAT* If patient is at the pharmacy, call can be transferred to refill team.   1. Which medications need to be refilled? (please list name of each medication and dose if known) clopidogrel (PLAVIX) 75 MG tablet   2. Which pharmacy/location (including street and city if local pharmacy) is medication to be sent to? [facility] [address], [place] - [address]   3. Do they need a 30 day or 90 day supply? 90

## 2022-11-05 ENCOUNTER — Other Ambulatory Visit: Payer: Self-pay

## 2022-11-06 ENCOUNTER — Encounter (HOSPITAL_COMMUNITY): Payer: Self-pay

## 2022-11-08 ENCOUNTER — Encounter: Payer: Self-pay | Admitting: *Deleted

## 2022-11-08 ENCOUNTER — Other Ambulatory Visit: Payer: Self-pay

## 2022-11-08 DIAGNOSIS — Z006 Encounter for examination for normal comparison and control in clinical research program: Secondary | ICD-10-CM

## 2022-11-08 NOTE — Research (Signed)
   60 Week Follow-Up Visit Completed*   []  Not Necessary, No Potential Adverse Events Or Medication Issues Reported On Completed Subject Questionnaire   [x]  Yes, Contact With Subject/Alternate Contact Completed   []  Yes, No Contact With Subject/Alternate Contact Completed, But Electronic Health Record Was Reviewed   []  No, Unable To Contact Subject/Alternate Contact   Have you reviewed Ongoing medications on the Targeted Concomitant Medication form and updated the form as needed?   [x]  Yes   []  No   Subject Status*   [x]  Continuing In Follow-up   []  At Risk For Lost To Follow-up   []  Withdrawal From All Future Study Activities Including Passive Follow-up By Electronic Health Record Review Or Contact With Healthcare Provider Or Family Member/Friend   []  Death   Vital Status*   [x]  Alive   []  Deceased   []  Unknown   Last Known To Be Alive Source*   [x]  Subject Completed Follow-up Questionnaire/Seen In Person/Via Telephone Contact   []  Family Member or Caretaker   []  Primary Physician Or Medical Records   []  Publicly Available Source   []  Other  Date of last dose taken   04-Nov-2022  Over the last 12 weeks did the subject miss any doses?n/a  Over the last 12 weeks did the subject restart Evolocumab after an interruption?n/a

## 2022-11-11 ENCOUNTER — Ambulatory Visit: Payer: Medicare Other | Admitting: Physician Assistant

## 2022-11-17 ENCOUNTER — Telehealth: Payer: Self-pay | Admitting: Physician Assistant

## 2022-11-17 NOTE — Telephone Encounter (Signed)
Pt wants to speak to someone about her medication refill on the Donepezil. Please call

## 2022-11-17 NOTE — Telephone Encounter (Signed)
Has had rx filled 30 tablets had been given at pharmacy, doesn't need at this time, will call when refill is needed. Patient thanked me for calling, no further questions were needed.

## 2022-11-23 DIAGNOSIS — Z79899 Other long term (current) drug therapy: Secondary | ICD-10-CM | POA: Diagnosis not present

## 2022-11-23 DIAGNOSIS — Z0000 Encounter for general adult medical examination without abnormal findings: Secondary | ICD-10-CM | POA: Diagnosis not present

## 2022-11-23 DIAGNOSIS — E7800 Pure hypercholesterolemia, unspecified: Secondary | ICD-10-CM | POA: Diagnosis not present

## 2022-11-23 DIAGNOSIS — D500 Iron deficiency anemia secondary to blood loss (chronic): Secondary | ICD-10-CM | POA: Diagnosis not present

## 2022-11-23 DIAGNOSIS — F17201 Nicotine dependence, unspecified, in remission: Secondary | ICD-10-CM | POA: Diagnosis not present

## 2022-11-23 DIAGNOSIS — E538 Deficiency of other specified B group vitamins: Secondary | ICD-10-CM | POA: Diagnosis not present

## 2022-11-23 DIAGNOSIS — I2510 Atherosclerotic heart disease of native coronary artery without angina pectoris: Secondary | ICD-10-CM | POA: Diagnosis not present

## 2022-11-28 ENCOUNTER — Encounter: Payer: Self-pay | Admitting: Emergency Medicine

## 2022-11-28 ENCOUNTER — Emergency Department
Admission: EM | Admit: 2022-11-28 | Discharge: 2022-11-28 | Disposition: A | Discharge status: Home / Self Care | Payer: Medicare Other | Attending: Emergency Medicine | Admitting: Emergency Medicine

## 2022-11-28 ENCOUNTER — Other Ambulatory Visit: Payer: Self-pay

## 2022-11-28 ENCOUNTER — Emergency Department: Payer: Medicare Other

## 2022-11-28 DIAGNOSIS — S0181XA Laceration without foreign body of other part of head, initial encounter: Secondary | ICD-10-CM | POA: Diagnosis not present

## 2022-11-28 DIAGNOSIS — Z7901 Long term (current) use of anticoagulants: Secondary | ICD-10-CM | POA: Diagnosis not present

## 2022-11-28 DIAGNOSIS — Z23 Encounter for immunization: Secondary | ICD-10-CM | POA: Insufficient documentation

## 2022-11-28 DIAGNOSIS — Y9301 Activity, walking, marching and hiking: Secondary | ICD-10-CM | POA: Diagnosis not present

## 2022-11-28 DIAGNOSIS — W01198A Fall on same level from slipping, tripping and stumbling with subsequent striking against other object, initial encounter: Secondary | ICD-10-CM | POA: Insufficient documentation

## 2022-11-28 DIAGNOSIS — S0003XA Contusion of scalp, initial encounter: Secondary | ICD-10-CM | POA: Diagnosis not present

## 2022-11-28 DIAGNOSIS — S0990XA Unspecified injury of head, initial encounter: Secondary | ICD-10-CM | POA: Diagnosis not present

## 2022-11-28 DIAGNOSIS — R220 Localized swelling, mass and lump, head: Secondary | ICD-10-CM | POA: Diagnosis not present

## 2022-11-28 MED ORDER — LIDOCAINE HCL (PF) 1 % IJ SOLN
5.0000 mL | Freq: Once | INTRAMUSCULAR | Status: AC
  Administered 2022-11-28: 5 mL via INTRADERMAL
  Filled 2022-11-28: qty 5

## 2022-11-28 MED ORDER — LIDOCAINE-EPINEPHRINE-TETRACAINE (LET) TOPICAL GEL
3.0000 mL | Freq: Once | TOPICAL | Status: AC
  Administered 2022-11-28: 3 mL via TOPICAL
  Filled 2022-11-28: qty 3

## 2022-11-28 MED ORDER — TETANUS-DIPHTH-ACELL PERTUSSIS 5-2.5-18.5 LF-MCG/0.5 IM SUSY
0.5000 mL | PREFILLED_SYRINGE | Freq: Once | INTRAMUSCULAR | Status: AC
  Administered 2022-11-28: 0.5 mL via INTRAMUSCULAR
  Filled 2022-11-28: qty 0.5
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
## 2022-11-28 NOTE — ED Triage Notes (Addendum)
Pt here with a head injury. Pt tripped and fell and hit her head, denies LOC. Pt had a open laceration on her forehead. Pt is on blood thinners. Pt ambulatory to triage.
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
## 2022-12-06 ENCOUNTER — Other Ambulatory Visit: Payer: Self-pay

## 2022-12-06 ENCOUNTER — Other Ambulatory Visit (HOSPITAL_COMMUNITY): Payer: Self-pay | Admitting: Cardiology

## 2022-12-06 ENCOUNTER — Other Ambulatory Visit (HOSPITAL_COMMUNITY): Payer: Self-pay

## 2022-12-06 MED ORDER — REPATHA SURECLICK 140 MG/ML ~~LOC~~ SOAJ
140.0000 mg | SUBCUTANEOUS | 2 refills | Status: DC
  Filled 2022-12-06 – 2023-01-17 (×2): qty 12, 168d supply, fill #0
  Filled 2023-07-06: qty 12, 168d supply, fill #1

## 2022-12-07 ENCOUNTER — Other Ambulatory Visit: Payer: Self-pay

## 2022-12-08 DIAGNOSIS — H43823 Vitreomacular adhesion, bilateral: Secondary | ICD-10-CM | POA: Diagnosis not present

## 2022-12-08 DIAGNOSIS — H31091 Other chorioretinal scars, right eye: Secondary | ICD-10-CM | POA: Diagnosis not present

## 2022-12-14 ENCOUNTER — Other Ambulatory Visit: Payer: Self-pay | Admitting: Physician Assistant

## 2022-12-27 ENCOUNTER — Ambulatory Visit: Payer: Medicare Other | Admitting: Physician Assistant

## 2023-01-12 ENCOUNTER — Other Ambulatory Visit: Payer: Self-pay

## 2023-01-17 ENCOUNTER — Other Ambulatory Visit: Payer: Self-pay

## 2023-01-17 ENCOUNTER — Other Ambulatory Visit (HOSPITAL_COMMUNITY): Payer: Self-pay | Admitting: Cardiology

## 2023-01-17 NOTE — Progress Notes (Signed)
Specialty Pharmacy Initial Fill Coordination Note  Leslie Cross is a 72 y.o. female contacted today regarding initial fill of specialty medication(s) Evolocumab   Patient requested Delivery   Delivery date: 01/20/23   Verified address: [address]   Medication will be filled on 12/4.   Patient is aware of $0 copayment.

## 2023-01-19 ENCOUNTER — Other Ambulatory Visit: Payer: Self-pay

## 2023-01-20 ENCOUNTER — Encounter: Payer: Self-pay | Admitting: *Deleted

## 2023-01-20 DIAGNOSIS — Z006 Encounter for examination for normal comparison and control in clinical research program: Secondary | ICD-10-CM

## 2023-02-12 NOTE — Progress Notes (Unsigned)
[facility]  PATIENT: Leslie Cross DOB: 27-Jan-1951  REFERRING DOCTOR OR PCP:  Aliene Beams, MD SOURCE: Patient, notes from primary care, imaging and lab reports, MRI images personally reviewed. _________________________________   HISTORICAL  CHIEF COMPLAINT:  Chief Complaint  Patient presents with   Room 10    Pt is here Alone. Pt states that her memory has declined since she stopped working 1 year ago. Pt states that some days her memory has been good, sometimes it is bad.     HISTORY OF PRESENT ILLNESS:  I had the pleasure of seeing the patient, Leslie Cross, at [facility] for a neurologic consultation regarding her memory loss.  She is a 72 yo woman who began to note memory issues in early 2024.  She notes more issues with her memory than other people note.   Other cognitive tasks feel unchanged ot her - I.e finances.     She notes she had an MI in mid 2023 and then returned to work.  Due to a workplace injury (sciatica after pushing box), she stopped working December 2023.   She has felt isolated since she stopped working as she does not see people and mostly stays in home.   She feels more depressed.    She sleeps poorly since the MI.   She reads before bedtime and she often falls asleep easily (1030-11 00 pm).   However, she has trouble staying asleep after awakening a few hours later and sometimes reads again to get sleepy.  She has nocturia once most nights.    She wakes up early by habit.     Due to hurting her back and not being at work, she is physically less active this year than last year.   She discussed her memory issues with her PCP in March 2024.  She had a MoCA 05/11/2022 which was 27/30.   She had low vitamin B12 of 161 and low folate.    She switched from one type of oral B12 to a B complex preparation.     She saw Juncal Marlowe Kays) and was started on Donepezil.  She does not feel it has helped her any       02/14/2023   10:22 AM 05/11/2022    4:00 PM  Montreal Cognitive Assessment   Visuospatial/ Executive (0/5) 5 3  Naming (0/3) 3 3  Attention: Read list of digits (0/2) 2 2  Attention: Read list of letters (0/1) 1 1  Attention: Serial 7 subtraction starting at 100 (0/3) 3 3  Language: Repeat phrase (0/2) 1 2  Language : Fluency (0/1) 1 0  Abstraction (0/2) 2 2  Delayed Recall (0/5) 1 5  Orientation (0/6) 6 6  Total 25 27  Adjusted Score (based on education) 25 27   She spontaneously did 1/5 and 3/5 with category prompt, 15 minutes later   Imaging: MRI of the head 05/26/2022 showed mild generalized cortical atrophy, typical for age, and white matter changes consistent with mild chronic microvascular ischemic changes.  No acute findings.  CT scan of the head 11/28/2022 showed no acute findings.  Mild generalized cortical atrophy, typical for age.  REVIEW OF SYSTEMS: Constitutional: No fevers, chills, sweats, or change in appetite Eyes: No visual changes, double vision, eye pain Ear, nose and throat: No hearing loss, ear pain, nasal congestion, sore throat Cardiovascular: No chest pain, palpitations.  She has a history of acute MI Respiratory:  No shortness of breath at rest  or with exertion.   No wheezes.  She has a history of DVT/PE GastrointestinaI: No nausea, vomiting, diarrhea, abdominal pain, fecal incontinence Genitourinary:  No dysuria, urinary retention or frequency.  No nocturia. Musculoskeletal:  No neck pain, back pain Integumentary: No rash, pruritus, skin lesions Neurological: as above Psychiatric: She notes depression and anxiety worsened after she quit her job Endocrine: No palpitations, diaphoresis, change in appetite, change in weigh or increased thirst Hematologic/Lymphatic:  No anemia, purpura, petechiae. Allergic/Immunologic: No itchy/runny eyes, nasal congestion, recent allergic reactions, rashes  ALLERGIES: Allergies  Allergen Reactions   Alpha-Gal  Anaphylaxis   Aminoglycosides Anaphylaxis   Beef-Derived Drug Products Anaphylaxis    Alpha-gal   Erythromycin Anaphylaxis   Keflex [Cephalexin] Anaphylaxis   Other Anaphylaxis and Other (See Comments)    Drugs ending in "cillin" - Anaphylaxis Drugs ending in "mycin" - Anaphylaxis     Pork-Derived Products Anaphylaxis    Alpha-gal   Penicillins Other (See Comments)    Did it involve swelling of the face/tongue/throat, SOB, or low BP? Unknown Did it involve sudden or severe rash/hives, skin peeling, or any reaction on the inside of your mouth or nose? Unknown Did you need to seek medical attention at a hospital or doctor's office? Unknown When did it last happen?      10 years If all above answers are "NO", may proceed with cephalosporin use.    HOME MEDICATIONS:  Current Outpatient Medications:    Ascorbic Acid (VITAMIN C PO), Take 1 tablet by mouth every evening., Disp: , Rfl:    aspirin 81 MG chewable tablet, Chew 1 tablet (81 mg total) by mouth daily., Disp: 30 tablet, Rfl: 11   Cholecalciferol (VITAMIN D-3 PO), Take 1 capsule by mouth every evening., Disp: , Rfl:    clopidogrel (PLAVIX) 75 MG tablet, Take 1 tablet (75 mg total) by mouth daily., Disp: 90 tablet, Rfl: 3   Cyanocobalamin (B-12) 1000 MCG TABS, Take by mouth., Disp: , Rfl:    donepezil (ARICEPT) 5 MG tablet, TAKE 1 TABLET BY MOUTH IN THE MORNING, Disp: 90 tablet, Rfl: 0   Evolocumab (REPATHA SURECLICK) 140 MG/ML SOAJ, Inject 140 mg into the skin every 14 (fourteen) days. For Investigational Use Only. Inject subcutaneously into abdomen, thigh, or upper arm every 14 days. Rotate injection sites and do not inject into areas where skin is tender, bruised, or red. Please contact [facility] for any questions or concerns regarding this medication., Disp: 12 mL, Rfl: 2   vitamin E 180 MG (400 UNITS) capsule, Take 400 Units by mouth daily., Disp: , Rfl:    FERROUS SULFATE PO, Take 1 tablet  by mouth every evening. (Patient not taking: Reported on 02/14/2023), Disp: , Rfl:    LORazepam (ATIVAN) 1 MG tablet, Take 1 tablet (1 mg total) by mouth as needed for anxiety (Prior to MRI). Take prior to leaving for MRI, can repeat prior to MRI if needed. (Patient not taking: Reported on 02/14/2023), Disp: 2 tablet, Rfl: 0   nitroGLYCERIN (NITROSTAT) 0.4 MG SL tablet, Place 1 tablet (0.4 mg total) under the tongue every 5 (five) minutes as needed for up to 25 days for chest pain., Disp: 25 tablet, Rfl: 3  PAST MEDICAL HISTORY: Past Medical History:  Diagnosis Date   Acute deep vein thrombosis (DVT) of right lower extremity (HCC) 08/25/2018   Acute myocardial infarction of anterolateral wall, initial episode of care (HCC) 09/25/2021   Acute ST elevation myocardial infarction (STEMI) of anterior wall (  HCC) 08/26/2021   B12 deficiency 08/21/2020   Hyperlipidemia LDL goal <70 09/25/2021   LAD stenosis    NSVT (nonsustained ventricular tachycardia) (HCC)    Pulmonary emboli (HCC) 08/29/2018   Pulmonary infarct (HCC) 08/25/2018   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) 12/12/2018    PAST SURGICAL HISTORY: Past Surgical History:  Procedure Laterality Date   ABDOMINAL HYSTERECTOMY     CARDIAC CATHETERIZATION     CORONARY/GRAFT ACUTE MI REVASCULARIZATION N/A 08/26/2021   Procedure: Coronary/Graft Acute MI Revascularization;  Surgeon: Yates Decamp, MD;  Location: [facility]VASIVE CV [facility];  Service: Cardiovascular;  Laterality: N/A;   LEFT HEART CATH AND CORONARY ANGIOGRAPHY N/A 08/26/2021   Procedure: LEFT HEART CATH AND CORONARY ANGIOGRAPHY;  Surgeon: Yates Decamp, MD;  Location: [facility]AS[facility] CV [facility];  Service: Cardiovascular;  Laterality: N/A;    FAMILY HISTORY: Family History  Problem Relation Age of Onset   Heart failure Father    Stroke Father 45   Heart attack Sister    Suicidality Sister    Prostate cancer Brother    CAD Neg Hx    Diabetes Mellitus I Neg Hx      SOCIAL HISTORY: Social History   Socioeconomic History   Marital status: Single    Spouse name: Not on file   Number of children: 3   Years of education: Not on file   Highest education level: Not on file  Occupational History   Not on file  Tobacco Use   Smoking status: Every Day    Current packs/day: 0.25    Types: Cigarettes   Smokeless tobacco: Never   Tobacco comments:    Cut back on smoking, 3 cigarettes daily stopped July 23  Vaping Use   Vaping status: Never Used  Substance and Sexual Activity   Alcohol use: No   Drug use: No   Sexual activity: Yes  Other Topics Concern   Not on file  Social History Narrative   1 child passed from cancer   1 child living with cancer   Are you right handed or left handed? Right   Are you currently employed ?    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 yes   Who lives with you?    What type of home do you live in: 1 story or 2 story? one    Caffeine 4 sodas a day   Social Drivers of Health   Financial Resource Strain: Unknown (08/25/2018)   Overall Financial Resource Strain (CARDIA)    Difficulty of Paying Living Expenses: Patient declined  Food Insecurity: Unknown (08/25/2018)   Hunger Vital Sign    Worried About Running Out of Food in the Last Year: Patient declined    Ran Out of Food in the Last Year: Patient declined  Transportation Needs: Unknown (08/25/2018)   PRAPARE - Administrator, Civil Service (Medical): Patient declined    Lack of Transportation (Non-Medical): Patient declined  Physical Activity: Unknown (08/25/2018)   Exercise Vital Sign    Days of Exercise per Week: Patient declined    Minutes of Exercise per Session: Patient declined  Stress: Not on file  Social Connections: Unknown (08/25/2018)   Social Connection and Isolation Panel [NHANES]    Frequency of Communication with Friends and Family: Patient declined    Frequency of Social Gatherings with Friends  and Family: Patient declined    Attends Religious Services: Patient declined  Active Member of Clubs or Organizations: Patient declined    Attends Banker Meetings: Patient declined    Marital Status: Patient declined  Intimate Partner Violence: Unknown (08/25/2018)   Humiliation, Afraid, Rape, and Kick questionnaire    Fear of Current or Ex-Partner: Patient declined    Emotionally Abused: Patient declined    Physically Abused: Patient declined    Sexually Abused: Patient declined       PHYSICAL EXAM  Vitals:   02/14/23 1018  BP: 111/61  Pulse: 80  Weight: 113 lb (51.3 kg)  Height: 5\' 2"  (1.575 m)    Body mass index is 20.67 kg/m.   General: The patient is well-developed and well-nourished and in no acute distress  HEENT:  Head is [place]/AT.  Sclera are anicteric.  Funduscopic exam shows normal optic discs and retinal vessels.  Neck: No carotid bruits are noted.  The neck is nontender.  Cardiovascular: The heart has a regular rate and rhythm with a normal S1 and S2. There were no murmurs, gallops or rubs.    Skin: Extremities are without rash or  edema.  Musculoskeletal:  Back is nontender  Neurologic Exam  Mental status: The patient is alert and oriented x 3 at the time of the examination.  Short-term memory was reduced but attention/concentration was normal.  Speech is normal.  Cranial nerves: Extraocular movements are full. Pupils are equal, round, and reactive to light and accomodation.  Visual fields are full.  Facial symmetry is present. There is good facial sensation to soft touch bilaterally.Facial strength is normal.  Trapezius and sternocleidomastoid strength is normal. No dysarthria is noted.  The tongue is midline, and the patient has symmetric elevation of the soft palate. No obvious hearing deficits are noted.  Motor:  Muscle bulk is normal.   Tone is normal. Strength is  5 / 5 in all 4 extremities.   Sensory: Sensory testing is intact to  pinprick, soft touch and vibration sensation in all 4 extremities.  Coordination: Cerebellar testing reveals good finger-nose-finger and heel-to-shin bilaterally.  Gait and station: Station is normal.   She has a limp favoring the left leg.. Tandem gait is fairly normal for age. Romberg is negative.   Reflexes: Deep tendon reflexes are symmetric and normal bilaterally.   Plantar responses are flexor.    DIAGNOSTIC DATA (LABS, IMAGING, TESTING) - I reviewed patient records, labs, notes, testing and imaging myself where available.  Lab Results  Component Value Date   WBC 7.9 09/22/2021   HGB 14.2 09/22/2021   HCT 41.5 09/22/2021   MCV 96.7 09/22/2021   PLT 266 09/22/2021      Component Value Date/Time   NA 138 09/22/2021 1118   NA 139 11/14/2019 0835   K 4.7 09/22/2021 1118   CL 106 09/22/2021 1118   CO2 24 09/22/2021 1118   GLUCOSE 100 (H) 09/22/2021 1118   BUN <5 (L) 09/22/2021 1118   BUN 6 (L) 11/14/2019 0835   CREATININE 0.84 09/22/2021 1118   CALCIUM 9.4 09/22/2021 1118   PROT 5.9 (L) 09/22/2021 1118   ALBUMIN 3.6 09/22/2021 1118   AST 24 09/22/2021 1118   ALT 20 09/22/2021 1118   ALKPHOS 159 (H) 09/22/2021 1118   BILITOT 0.8 09/22/2021 1118   GFRNONAA >60 09/22/2021 1118   GFRAA 103 11/14/2019 0835   Lab Results  Component Value Date   CHOL 62 (L) 10/29/2021   HDL 43 10/29/2021   LDLCALC 0 10/29/2021   TRIG 97 10/29/2021  CHOLHDL 5.9 08/26/2021   Lab Results  Component Value Date   HGBA1C 5.4 08/26/2021   Lab Results  Component Value Date   VITAMINB12 161 (L) 05/11/2022   Lab Results  Component Value Date   TSH 1.00 05/11/2022       ASSESSMENT AND PLAN  Mild cognitive impairment  Memory impairment  Depression with anxiety  B12 deficiency  Folate deficiency  Insomnia, unspecified type  In summary, [name] is a 72 year old woman with complaints of memory loss over the past year.  Of note, symptoms worsened after she stopped  working.  She notes social isolation and worsening depression/anxiety.  She also has poor sleep.  She feels her cognitive issues are worse than other people note.  Due to the mood and sleep issues, it is probable that her mild cognitive impairment is due mostly to focus/attention.  Of note, she does not have medial temporal lobe atrophy that is common with Alzheimer's disease.  I will start Zoloft for her mood disturbance.  Hopefully this will help her sleep as well if anxiety improves.  I will check some lab work.  If the B12 is still low, she might need B12 shots rather than oral supplementation.  I discussed with her that regardless of the cause of her memory loss, eating well and staying physically, socially and mentally active is likely to help as much or more than any medication work.  For now, she will continue the donepezil.  However, if the AB 40/42 ratio is not abnormal, she could consider stopping.  She will return to see me in 5 to 6 months or sooner if there are new or worsening neurologic symptoms.   This visit is part of a comprehensive longitudinal care medical relationship regarding the patients primary diagnosis of cognitive impairment and related concerns.    Forrester Blando A. Epimenio Foot, MD, [facility] 02/14/2023, 10:37 AM Certified in Neurology, Clinical Neurophysiology, Sleep Medicine and Neuroimaging  [facility] [address], [address] [place], [place] [address] [phone]

## 2023-02-14 ENCOUNTER — Encounter: Payer: Self-pay | Admitting: Neurology

## 2023-02-14 ENCOUNTER — Ambulatory Visit: Payer: Medicare Other | Admitting: Neurology

## 2023-02-14 VITALS — BP 111/61 | HR 80 | Ht 62.0 in | Wt 113.0 lb

## 2023-02-14 DIAGNOSIS — G3184 Mild cognitive impairment, so stated: Secondary | ICD-10-CM

## 2023-02-14 DIAGNOSIS — G4700 Insomnia, unspecified: Secondary | ICD-10-CM

## 2023-02-14 DIAGNOSIS — E538 Deficiency of other specified B group vitamins: Secondary | ICD-10-CM | POA: Diagnosis not present

## 2023-02-14 DIAGNOSIS — R413 Other amnesia: Secondary | ICD-10-CM

## 2023-02-14 DIAGNOSIS — F418 Other specified anxiety disorders: Secondary | ICD-10-CM

## 2023-02-14 MED ORDER — SERTRALINE HCL 50 MG PO TABS: 50.0000 mg | ORAL_TABLET | Freq: Every day | ORAL | 3 refills | Status: DC

## 2023-02-17 LAB — ATN PROFILE
A -- Beta-amyloid 42/40 Ratio: 0.129 (ref >0.102)
Beta-amyloid 40: 233.41 pg/mL
Beta-amyloid 42: 30.12 pg/mL
N -- NfL, Plasma: 5.03 pg/mL (ref 0.00–7.64)
T -- p-tau181: 1.02 pg/mL — ABNORMAL HIGH (ref 0.00–0.97)

## 2023-02-17 LAB — VITAMIN B12: Vitamin B-12: 1045 pg/mL (ref 232–1245)

## 2023-02-17 LAB — FOLATE

## 2023-03-16 ENCOUNTER — Telehealth: Payer: Self-pay | Admitting: Neurology

## 2023-03-16 DIAGNOSIS — G3184 Mild cognitive impairment, so stated: Secondary | ICD-10-CM

## 2023-03-16 DIAGNOSIS — R413 Other amnesia: Secondary | ICD-10-CM

## 2023-03-16 MED ORDER — DONEPEZIL HCL 10 MG PO TABS: 10.0000 mg | ORAL_TABLET | Freq: Every morning | ORAL | 3 refills | Status: AC

## 2023-03-16 NOTE — Telephone Encounter (Signed)
Pt wants to know if Dr Epimenio Foot will manage her donepezil (ARICEPT) 5 MG tablet, if so as of tomorrow she will need a refill, and would like it called into [id number]

## 2023-03-16 NOTE — Telephone Encounter (Signed)
LVM for pt to call office

## 2023-03-16 NOTE — Telephone Encounter (Signed)
Called pt. Relayed Dr. Bonnita Hollow message. She verbalized understanding.

## 2023-03-16 NOTE — Telephone Encounter (Signed)
Took call from phone staff and spoke w/ pt. She is tolerating donepezil 5mg  po every day well. Agreeable to increase to 10mg . I e-scribed rx to [facility] on file per pt request.   She also reports she took sertraline 50mg  (1/2 tablet) and it caused diarrhea and she was up all night. She d/c'd.  I asked if she wanted another option to try. She states "whatever the doctor feels". Aware I will send to MD to review

## 2023-03-16 NOTE — Telephone Encounter (Signed)
Dr. Epimenio Foot- are you ok with taking over refilling donepezil?

## 2023-03-21 ENCOUNTER — Institutional Professional Consult (permissible substitution): Payer: No Typology Code available for payment source | Admitting: Psychology

## 2023-03-21 ENCOUNTER — Ambulatory Visit: Payer: Self-pay

## 2023-03-28 ENCOUNTER — Encounter: Payer: Medicare Other | Admitting: Psychology

## 2023-04-11 ENCOUNTER — Encounter: Payer: Self-pay | Admitting: *Deleted

## 2023-04-11 DIAGNOSIS — Z006 Encounter for examination for normal comparison and control in clinical research program: Secondary | ICD-10-CM

## 2023-04-11 NOTE — Research (Signed)
 84 Week Follow-Up Visit Completed*   []  Not Necessary, No Potential Adverse Events Or Medication Issues Reported On Completed Subject Questionnaire   [x]  Yes, Contact With Subject/Alternate Contact Completed   []  Yes, No Contact With Subject/Alternate Contact Completed, But Electronic Health Record Was Reviewed   []  No, Unable To Contact Subject/Alternate Contact   Have you reviewed Ongoing medications on the Targeted Concomitant Medication form and updated the form as needed?   [x]  Yes   []  No   Subject Status*   [x]  Continuing In Follow-up   []  At Risk For Lost To Follow-up   []  Withdrawal From All Future Study Activities Including Passive Follow-up By Electronic Health Record Review Or Contact With Healthcare Provider Or Family Member/Friend   []  Death   Vital Status*   [x]  Alive   []  Deceased   []  Unknown   Last Known To Be Alive Source*   [x]  Subject Completed Follow-up Questionnaire/Seen In Person/Via Telephone Contact   []  Family Member or Caretaker   []  Primary Physician Or Medical Records   []  Publicly Available Source   []  Other  Date of last dose taken   07-Apr-2023  Over the last 12 weeks did the subject miss any doses? no  Over the last 12 weeks did the subject restart Evolocumab after an interruption?no

## 2023-07-06 ENCOUNTER — Other Ambulatory Visit: Payer: Self-pay

## 2023-07-06 ENCOUNTER — Other Ambulatory Visit (HOSPITAL_COMMUNITY): Payer: Self-pay

## 2023-07-06 NOTE — Progress Notes (Signed)
 Specialty Pharmacy Refill Coordination Note  Leslie Cross is a 74 y.o. female contacted today regarding refills of specialty medication(s) Evolocumab  (Repatha  SureClick)   Patient requested Delivery   Delivery date: 07/20/23   Verified address: [address]   Medication will be filled on 07/19/23.

## 2023-07-20 ENCOUNTER — Encounter: Payer: Self-pay | Admitting: *Deleted

## 2023-07-20 DIAGNOSIS — Z006 Encounter for examination for normal comparison and control in clinical research program: Secondary | ICD-10-CM

## 2023-08-16 NOTE — Research (Signed)
 96  Week Follow-Up Visit Completed*   []  Not Necessary, No Potential Adverse Events Or Medication Issues Reported On Completed Subject Questionnaire   [x]  Yes, Contact With Subject/Alternate Contact Completed   []  Yes, No Contact With Subject/Alternate Contact Completed, But Electronic Health Record Was Reviewed   []  No, Unable To Contact Subject/Alternate Contact   Have you reviewed Ongoing medications on the Targeted Concomitant Medication form and updated the form as needed?   [x]  Yes   []  No   Subject Status*   [x]  Continuing In Follow-up   []  At Risk For Lost To Follow-up   []  Withdrawal From All Future Study Activities Including Passive Follow-up By Electronic Health Record Review Or Contact With Healthcare Provider Or Family Member/Friend   []  Death   Vital Status*   [x]  Alive   []  Deceased   []  Unknown   Last Known To Be Alive Source*   [x]  Subject Completed Follow-up Questionnaire/Seen In Person/Via Telephone Contact   []  Family Member or Caretaker   []  Primary Physician Or Medical Records   []  Publicly Available Source   []  Other  Date of last dose taken   07-Jul-2023  Over the last 12 weeks did the subject miss any doses? No   Over the last 12 weeks did the subject restart Evolocumab  after an interruption? No

## 2023-09-01 ENCOUNTER — Encounter: Payer: Self-pay | Admitting: Neurology

## 2023-09-01 ENCOUNTER — Ambulatory Visit: Payer: Medicare Other | Admitting: Neurology

## 2023-09-01 VITALS — Wt 114.0 lb

## 2023-09-01 DIAGNOSIS — E538 Deficiency of other specified B group vitamins: Secondary | ICD-10-CM

## 2023-09-01 DIAGNOSIS — F418 Other specified anxiety disorders: Secondary | ICD-10-CM

## 2023-09-01 DIAGNOSIS — R413 Other amnesia: Secondary | ICD-10-CM

## 2023-09-01 DIAGNOSIS — D500 Iron deficiency anemia secondary to blood loss (chronic): Secondary | ICD-10-CM | POA: Insufficient documentation

## 2023-09-01 DIAGNOSIS — G3184 Mild cognitive impairment, so stated: Secondary | ICD-10-CM | POA: Diagnosis not present

## 2023-09-01 MED ORDER — ESCITALOPRAM OXALATE 5 MG PO TABS: 10.0000 mg | ORAL_TABLET | Freq: Every day | ORAL | 5 refills | Status: AC

## 2023-09-01 NOTE — Progress Notes (Signed)
 [facility]  PATIENT: Leslie Cross DOB: 02-06-1951  REFERRING DOCTOR OR PCP:  Vernell Fort, MD SOURCE: Patient, notes from primary care, imaging and lab reports, MRI images personally reviewed. _________________________________   HISTORICAL  CHIEF COMPLAINT:  Chief Complaint  Patient presents with   RM10/MEMORY    Pt is here Alone. Pt states that the Donepezil  made her Suicidal and catatonic  and she stopped her medication. Pt states that when she had gotten hurt in 2023 everything has gone down hill. Pt states that she feels like her memory has gotten worse since her last appointment.     HISTORY OF PRESENT ILLNESS:  Leslie Cross is a 73 y.o. woman with memory loss.  Update 09/01/2023 Since the last visit she has had blood work including the ATN profile.  The amyloid beta 42/40 ratio was normal.  pTau181 was very minimally elevated at 1.02.  Neurofilament light was normal.  Vitamin B12 was normal.  She feels the STM is a tad worse now than it was earlier this year.   She will recall better with hints..     She notes anxiety and depression, worse since she stopped working.     She sleeps poorly since the MI.   She reads before bedtime and she often falls asleep easily (1030-11 00 pm).   However, she has trouble staying asleep after awakening a few hours later and sometimes reads again to get sleepy.  She has nocturia once most nights.    She wakes up early by habit.       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     History of memory issues: She began to note memory issues in early 2024.  She notes more issues with her memory than other people note.   Other cognitive tasks feel unchanged ot her - I.e finances.     She notes she had an MI in mid 2023 and then returned to work.  Due to a workplace injury (sciatica after pushing box), she stopped working December 2023.   She has felt isolated since she  stopped working as she does not see people and mostly stays in home.   She feels more depressed.    She discussed her memory issues with her PCP in March 2024.  She had a MoCA 05/11/2022 which was 27/30.   She had low vitamin B12 of 161 and low folate.    She switched from one type of oral B12 to a B complex preparation.     She saw [place] Jayson Sevin) in 2024 and was started on Donepezil .  She felt she did not tolerate it well and stopped a few months ago   She has a GED      09/01/2023   73:36 AM 02/14/2023   10:73 AM 05/11/2022    73:00 PM  Montreal Cognitive Assessment   Visuospatial/ Executive (0/5) 5 5 3   Naming (0/3) 3 3 3   Attention: Read list of digits (0/2) 2 2 2   Attention: Read list of letters (0/1) 1 1 1   Attention: Serial 7 subtraction starting at 100 (0/3) 2 3 3   Language: Repeat phrase (0/2) 2 1 2   Language : Fluency (0/1) 1 1 0  Abstraction (0/2) 2 2 2   Delayed Recall (0/5) 0 1 5  Orientation (0/6) 5 6 6   Total 23 25 27   Adjusted Score (based on education) 24 25 27    09/01/23:  With prompt recalled 3/5 words   Imaging: MRI of the head 05/26/2022 showed mild generalized cortical atrophy, typical for age, and white matter changes consistent with mild chronic microvascular ischemic changes.  No acute findings.  CT scan of the head 11/28/2022 showed no acute findings.  Mild generalized cortical atrophy, typical for age.  REVIEW OF SYSTEMS: Constitutional: No fevers, chills, sweats, or change in appetite Eyes: No visual changes, double vision, eye pain Ear, nose and throat: No hearing loss, ear pain, nasal congestion, sore throat Cardiovascular: No chest pain, palpitations.  She has a history of acute MI Respiratory:  No shortness of breath at rest or with exertion.   No wheezes.  She has a history of DVT/PE GastrointestinaI: No nausea, vomiting, diarrhea, abdominal pain, fecal incontinence Genitourinary:  No dysuria, urinary retention or frequency.  No  nocturia. Musculoskeletal:  No neck pain, back pain Integumentary: No rash, pruritus, skin lesions Neurological: as above Psychiatric: She notes depression and anxiety worsened after she quit her job Endocrine: No palpitations, diaphoresis, change in appetite, change in weigh or increased thirst Hematologic/Lymphatic:  No anemia, purpura, petechiae. Allergic/Immunologic: No itchy/runny eyes, nasal congestion, recent allergic reactions, rashes  ALLERGIES: Allergies  Allergen Reactions   Alpha-Gal Anaphylaxis   Aminoglycosides Anaphylaxis   Beef-Derived Drug Products Anaphylaxis    Alpha-gal   Erythromycin Anaphylaxis   Keflex [Cephalexin] Anaphylaxis   Other Anaphylaxis and Other (See Comments)    Drugs ending in cillin - Anaphylaxis Drugs ending in mycin - Anaphylaxis     Pork-Derived Products Anaphylaxis    Alpha-gal   Donepezil  Other (See Comments)    Suicidal Catatonic     Sertraline  Diarrhea   Penicillins Other (See Comments)    Did it involve swelling of the face/tongue/throat, SOB, or low BP? Unknown Did it involve sudden or severe rash/hives, skin peeling, or any reaction on the inside of your mouth or nose? Unknown Did you need to seek medical attention at a hospital or doctor's office? Unknown When did it last happen?      10 years If all above answers are NO, may proceed with cephalosporin use.    HOME MEDICATIONS:  Current Outpatient Medications:    Ascorbic Acid (VITAMIN C PO), Take 1 tablet by mouth every evening., Disp: , Rfl:    aspirin  81 MG chewable tablet, Chew 1 tablet (81 mg total) by mouth daily., Disp: 30 tablet, Rfl: 11   Cholecalciferol (VITAMIN D-3 PO), Take 1 capsule by mouth every evening., Disp: , Rfl:    clopidogrel  (PLAVIX ) 75 MG tablet, Take 1 tablet (75 mg total) by mouth daily., Disp: 90 tablet, Rfl: 3   Cyanocobalamin  (B-12) 1000 MCG TABS, Take by mouth., Disp: , Rfl:    escitalopram  (LEXAPRO ) 5 MG tablet, Take 2 tablets (10 mg  total) by mouth daily., Disp: 30 tablet, Rfl: 5   Evolocumab  (REPATHA  SURECLICK) 140 MG/ML SOAJ, Inject 140 mg into the skin every 14 (fourteen) days. For Investigational Use Only. Inject subcutaneously into abdomen, thigh, or upper arm every 14 days. Rotate injection sites and do not inject into areas where skin is tender, bruised, or red. Please contact [facility] for any questions or concerns regarding this medication., Disp: 12 mL, Rfl: 2   vitamin E 180 MG (400 UNITS) capsule, Take 400 Units by mouth daily., Disp: , Rfl:    donepezil  (ARICEPT ) 10 MG tablet, Take 1 tablet (10 mg total) by mouth every morning. (Patient not taking: Reported on 09/01/2023), Disp:  90 tablet, Rfl: 3   FERROUS SULFATE  PO, Take 1 tablet by mouth every evening. (Patient not taking: Reported on 09/01/2023), Disp: , Rfl:    LORazepam  (ATIVAN ) 1 MG tablet, Take 1 tablet (1 mg total) by mouth as needed for anxiety (Prior to MRI). Take prior to leaving for MRI, can repeat prior to MRI if needed. (Patient not taking: Reported on 09/01/2023), Disp: 2 tablet, Rfl: 0   nitroGLYCERIN  (NITROSTAT ) 0.4 MG SL tablet, Place 1 tablet (0.4 mg total) under the tongue every 5 (five) minutes as needed for up to 25 days for chest pain. (Patient not taking: Reported on 09/01/2023), Disp: 25 tablet, Rfl: 3  PAST MEDICAL HISTORY: Past Medical History:  Diagnosis Date   Acute deep vein thrombosis (DVT) of right lower extremity (HCC) 08/25/2018   Acute myocardial infarction of anterolateral wall, initial episode of care (HCC) 09/25/2021   Acute ST elevation myocardial infarction (STEMI) of anterior wall (HCC) 08/26/2021   B12 deficiency 08/21/2020   Hyperlipidemia LDL goal <70 09/25/2021   LAD stenosis    NSVT (nonsustained ventricular tachycardia) (HCC)    Pulmonary emboli (HCC) 08/29/2018   Pulmonary infarct (HCC) 08/25/2018   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) 12/12/2018     PAST SURGICAL HISTORY: Past Surgical History:  Procedure Laterality Date   ABDOMINAL HYSTERECTOMY     CARDIAC CATHETERIZATION     CORONARY/GRAFT ACUTE MI REVASCULARIZATION N/A 08/26/2021   Procedure: Coronary/Graft Acute MI Revascularization;  Surgeon: Ladona Heinz, MD;  Location: [facility] INVASIVE CV [facility];  Service: Cardiovascular;  Laterality: N/A;   LEFT HEART CATH AND CORONARY ANGIOGRAPHY N/A 08/26/2021   Procedure: LEFT HEART CATH AND CORONARY ANGIOGRAPHY;  Surgeon: Ladona Heinz, MD;  Location: [facility] INVASIVE CV [facility];  Service: Cardiovascular;  Laterality: N/A;    FAMILY HISTORY: Family History  Problem Relation Age of Onset   Heart failure Father    Stroke Father 28   Heart attack Sister    Suicidality Sister    Prostate cancer Brother    CAD Neg Hx    Diabetes Mellitus I Neg Hx     SOCIAL HISTORY: Social History   Socioeconomic History   Marital status: Single    Spouse name: Not on file   Number of children: 3   Years of education: Not on file   Highest education level: Not on file  Occupational History   Not on file  Tobacco Use   Smoking status: Every Day    Current packs/day: 0.25    Types: Cigarettes   Smokeless tobacco: Never   Tobacco comments:    Cut back on smoking, 3 cigarettes daily stopped July 23  Vaping Use   Vaping status: Never Used  Substance and Sexual Activity   Alcohol use: No   Drug use: No   Sexual activity: Yes  Other Topics Concern   Not on file  Social History Narrative   1 child passed from cancer   1 child living with cancer   Are you right handed or left handed? Right   Are you currently employed ?    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 yes   Who lives with you?    What type of home do you live in: 1 story or 2 story? one    Caffeine 4 sodas a day   Social Drivers of Health   Financial Resource Strain: Unknown (08/25/2018)   Overall Physicist, medical  Strain (CARDIA)    Difficulty of Paying  Living Expenses: Patient declined  Food Insecurity: Unknown (08/25/2018)   Hunger Vital Sign    Worried About Running Out of Food in the Last Year: Patient declined    Ran Out of Food in the Last Year: Patient declined  Transportation Needs: Unknown (08/25/2018)   PRAPARE - Administrator, Civil Service (Medical): Patient declined    Lack of Transportation (Non-Medical): Patient declined  Physical Activity: Unknown (08/25/2018)   Exercise Vital Sign    Days of Exercise per Week: Patient declined    Minutes of Exercise per Session: Patient declined  Stress: Not on file  Social Connections: Unknown (08/25/2018)   Social Connection and Isolation Panel    Frequency of Communication with Friends and Family: Patient declined    Frequency of Social Gatherings with Friends and Family: Patient declined    Attends Religious Services: Patient declined    Database administrator or Organizations: Patient declined    Attends Banker Meetings: Patient declined    Marital Status: Patient declined  Intimate Partner Violence: Unknown (08/25/2018)   Humiliation, Afraid, Rape, and Kick questionnaire    Fear of Current or Ex-Partner: Patient declined    Emotionally Abused: Patient declined    Physically Abused: Patient declined    Sexually Abused: Patient declined       PHYSICAL EXAM  Vitals:   09/01/23 1018  Weight: 114 lb (51.7 kg)     Body mass index is 20.85 kg/m.   General: The patient is well-developed and well-nourished and in no acute distress  HEENT:  Head is [place]/AT.  Sclera are anicteric.   Neck: Good range of motion..     Skin: Extremities are without rash or  edema.   Neurologic Exam  Mental status: The patient is alert and oriented x 2-3 at the time of the examination.  Short-term memory was reduced.SABRA  Speech is normal.  Cranial nerves: Extraocular movements are full.  Facial strength and sensation was normal.  Trapezius and sternocleidomastoid  strength is normal. No dysarthria is noted.  The tongue is midline, and the patient has symmetric elevation of the soft palate. No obvious hearing deficits are noted.  Motor:  Muscle bulk is normal.   Tone is normal. Strength is  5 / 5 in all 4 extremities.   Sensory: Sensory testing is intact to pinprick, soft touch and vibration sensation in all 4 extremities.  Coordination: Cerebellar testing reveals good finger-nose-finger and heel-to-shin bilaterally.  Gait and station: Station is normal.   She has a limp favoring the left leg.. Tandem gait is fairly normal for age. Romberg is negative.   Reflexes: Deep tendon reflexes are symmetric and normal bilaterally.       DIAGNOSTIC DATA (LABS, IMAGING, TESTING) - I reviewed patient records, labs, notes, testing and imaging myself where available.  Lab Results  Component Value Date   WBC 7.9 09/22/2021   HGB 14.2 09/22/2021   HCT 41.5 09/22/2021   MCV 96.7 09/22/2021   PLT 266 09/22/2021      Component Value Date/Time   NA 138 09/22/2021 1118   NA 139 11/14/2019 0835   K 4.7 09/22/2021 1118   CL 106 09/22/2021 1118   CO2 24 09/22/2021 1118   GLUCOSE 100 (H) 09/22/2021 1118   BUN <5 (L) 09/22/2021 1118   BUN 6 (L) 11/14/2019 0835   CREATININE 0.84 09/22/2021 1118   CALCIUM  9.4 09/22/2021 1118   PROT  5.9 (L) 09/22/2021 1118   ALBUMIN 3.6 09/22/2021 1118   AST 24 09/22/2021 1118   ALT 20 09/22/2021 1118   ALKPHOS 159 (H) 09/22/2021 1118   BILITOT 0.8 09/22/2021 1118   GFRNONAA >60 09/22/2021 1118   GFRAA 103 11/14/2019 0835   Lab Results  Component Value Date   CHOL 62 (L) 10/29/2021   HDL 43 10/29/2021   LDLCALC 0 10/29/2021   TRIG 97 10/29/2021   CHOLHDL 5.9 08/26/2021   Lab Results  Component Value Date   HGBA1C 5.4 08/26/2021   Lab Results  Component Value Date   VITAMINB12 1,045 02/14/2023   Lab Results  Component Value Date   TSH 1.00 05/11/2022       ASSESSMENT AND PLAN  Mild cognitive  impairment  Memory impairment  Depression with anxiety  B12 deficiency  She has mild cognitive impairment.  Most of the points she lost on the [facility] cognitive assessment were due to short-term recall (score 0/5).  However, with hints she was able to recall 3 of the 5 words.  The amyloid beta 42/40 ratio was normal.  Therefore, I believe it is more likely that her memory problems are due to reduced focus/attention. She has had increased anxiety and depression since she had to stop working (also when when she started to note the cognitive symptoms) which could lead to reduced focus/attention which could affect her cognition.  I placed her on a low-dose of escitalopram .  That dose can be increased based on tolerability and benefit. Since cognition is fairly stable she could follow-up as needed for significant new or worsening neurologic symptoms.   This visit is part of a comprehensive longitudinal care medical relationship regarding the patients primary diagnosis of cognitive impairment and related concerns.    Calen Posch A. Vear, MD, [facility] 09/01/2023, 10:42 AM Certified in Neurology, Clinical Neurophysiology, Sleep Medicine and Neuroimaging  [facility] [address] [place], [place] [address] [phone]

## 2023-10-10 ENCOUNTER — Telehealth: Payer: Self-pay | Admitting: Cardiology

## 2023-10-10 MED ORDER — CLOPIDOGREL BISULFATE 75 MG PO TABS: 75.0000 mg | ORAL_TABLET | Freq: Every day | ORAL | 0 refills | Status: DC

## 2023-10-10 NOTE — Telephone Encounter (Signed)
 RX sent in

## 2023-10-10 NOTE — Telephone Encounter (Signed)
*  STAT* If patient is at the pharmacy, call can be transferred to refill team.   1. Which medications need to be refilled? (please list name of each medication and dose if known)   clopidogrel  (PLAVIX ) 75 MG tablet    2. Which pharmacy/location (including street and city if local pharmacy) is medication to be sent to? [facility] [place], [place] - [address] Phone: [phone]  Fax: [phone]       Significant History/Details      3. Do they need a 30 day or 90 day supply? 90

## 2023-10-24 ENCOUNTER — Telehealth: Payer: Self-pay | Admitting: *Deleted

## 2023-10-24 ENCOUNTER — Encounter: Payer: Self-pay | Admitting: *Deleted

## 2023-10-24 DIAGNOSIS — Z006 Encounter for examination for normal comparison and control in clinical research program: Secondary | ICD-10-CM

## 2023-10-24 NOTE — Research (Signed)
 Week 108 Follow-Up Visit Completed*   []  Not Necessary, No Potential Adverse Events Or Medication Issues Reported On Completed Subject Questionnaire   [x]  Yes, Contact With Subject/Alternate Contact Completed   []  Yes, No Contact With Subject/Alternate Contact Completed, But Electronic Health Record Was Reviewed   []  No, Unable To Contact Subject/Alternate Contact   Have you reviewed Ongoing medications on the Targeted Concomitant Medication form and updated the form as needed?   [x]  Yes   []  No   Subject Status*   [x]  Continuing In Follow-up   []  At Risk For Lost To Follow-up   []  Withdrawal From All Future Study Activities Including Passive Follow-up By Electronic Health Record Review Or Contact With Healthcare Provider Or Family Member/Friend   []  Death   Vital Status*   [x]  Alive   []  Deceased   []  Unknown   Last Known To Be Alive Source*   [x]  Subject Completed Follow-up Questionnaire/Seen In Person/Via Telephone Contact   []  Family Member or Caretaker   []  Primary Physician Or Medical Records   []  Publicly Available Source   []  Other  Date of last dose taken   20-Oct-2023  Over the last 12 weeks did the subject miss any doses? No  Over the last 12 weeks did the subject restart Evolocumab  after an interruption? No   Patient has noticed  a rash the next day after taking the REPATHA  the last two injections

## 2023-10-24 NOTE — Telephone Encounter (Addendum)
 Patient called and stated she notices she gets a rash below here knees and states it has been happening when you takes her REPATHA  injection. I told patient to not take her next two injections and I will discuss this with Dr. Morris.    [place] Staria Birkhead 10/24/2023  09:05 am  I reviewed this plan with Ms. Javan and agree with the plan.  It would be worth another attempt after a brief break to see if the rash occurs without injection, or discover if temporally related to the injection.  The focal, below the knee is not generalized, so seems worth one more attempt.  I called and left a message on her machine to call back.

## 2023-10-27 DIAGNOSIS — M545 Low back pain, unspecified: Secondary | ICD-10-CM | POA: Insufficient documentation

## 2023-10-31 NOTE — Progress Notes (Signed)
  Cardiology Office Note:  .   Date:  11/07/2023  ID:  Leslie Cross, DOB [birth year], MRN [id number] PCP: Rolinda Millman, MD  [facility] Providers Cardiologist:  Oneil Parchment, MD    History of Present Illness: .   Leslie Cross is a 73 y.o. female  with history of CAD S/P Anterior STEMI July 2023 DES to mid LAD, Questionable allergy to Brilinta  with right hand and foot swelling changed to Plavix , decided to continue it. Hair loss Toprol  discontinued. Ischemic cardiomyopathy with recovery of ejection fraction from 35% to normal 60%. History of DVT and PE in 2020 Hyperlipidemia.Former tobacco us .  Patient comes in for yearly f/u. She walks 1 mile daily but can't do much more due to back pain. No chest pain, dyspnea, palpitations, edema. Says she's gained weight due to back issues. Has an itchy rash on legs and upper back that occurred after her last repatha  shot Sept 4 but it hasn't gone away.   ROS:    Studies Reviewed: SABRA    EKG Interpretation Date/Time:  Monday November 07 2023 07:57:00 EDT Ventricular Rate:  71 PR Interval:  150 QRS Duration:  80 QT Interval:  390 QTC Calculation: 423 R Axis:   51  Text Interpretation: Normal sinus rhythm Anterior infarct (cited on or before 07-Nov-2023) When compared with ECG of 13-Sep-2022 09:01, No significant change was found Confirmed by Parthenia Klinefelter [phone]) on 11/07/2023 8:08:15 AM    Prior CV Studies:    Diagnostic Dominance: Right  Intervention     Risk Assessment/Calculations:             Physical Exam:   VS:  BP (!) 90/58   Pulse 71   Ht 5' 2 (1.575 m)   Wt 113 lb (51.3 kg)   SpO2 99%   BMI 20.67 kg/m    Orhtostatics: No data found. Wt Readings from Last 3 Encounters:  11/07/23 113 lb (51.3 kg)  09/01/23 114 lb (51.7 kg)  02/14/23 113 lb (51.3 kg)    GEN: Thin, in no acute distress NECK: No JVD; No carotid bruits CARDIAC:  RRR, some skipping, no murmurs, rubs, gallops RESPIRATORY:  Clear to  auscultation without rales, wheezing or rhonchi  ABDOMEN: Soft, non-tender, non-distended EXTREMITIES:  No edema; No deformity. Rash on upper/lower legs/back look like bug bites-lesions  ASSESSMENT AND PLAN: .    Coronary artery disease status post anterior STEMI - Mid LAD stent 2023  - On ASA and Plavix .  (EVOLVE-MI) study.  Stable doing well without angina -check surveillance labs today   Ischemic cardiomyopathy - EF improved from 35% up to 60% postintervention.  Euvolemic   Hyperlipidemia - LDL 18 in 2023.  On  Repatha  140 mg every 2 weeks but on hold due to rash but not getting better so doubt it's Repatha    Memory loss/anxiety/insomnia - followed by neurology.        Rash on legs/back-look like bites-erythema with eruptions. Repatha  on hold but not improving. Using benadryl spray. Refer to dermatology.        Dispo: f/u in 1 yr.  Signed, Klinefelter Parthenia, PA-C

## 2023-11-07 ENCOUNTER — Ambulatory Visit: Attending: Physician Assistant | Admitting: Physician Assistant

## 2023-11-07 ENCOUNTER — Encounter: Payer: Self-pay | Admitting: Physician Assistant

## 2023-11-07 VITALS — BP 90/58 | HR 71 | Ht 62.0 in | Wt 113.0 lb

## 2023-11-07 DIAGNOSIS — I2510 Atherosclerotic heart disease of native coronary artery without angina pectoris: Secondary | ICD-10-CM | POA: Diagnosis not present

## 2023-11-07 DIAGNOSIS — E785 Hyperlipidemia, unspecified: Secondary | ICD-10-CM

## 2023-11-07 DIAGNOSIS — R413 Other amnesia: Secondary | ICD-10-CM

## 2023-11-07 DIAGNOSIS — R21 Rash and other nonspecific skin eruption: Secondary | ICD-10-CM | POA: Diagnosis not present

## 2023-11-07 DIAGNOSIS — I255 Ischemic cardiomyopathy: Secondary | ICD-10-CM | POA: Diagnosis not present

## 2023-11-07 NOTE — Patient Instructions (Signed)
 Medication Instructions:  Your physician recommends that you continue on your current medications as directed. Please refer to the Current Medication list given to you today.  *If you need a refill on your cardiac medications before your next appointment, please call your pharmacy*  Lab Work: TODAY- CBC, CMET, TSH, FASTING LIPIDs If you have labs (blood work) drawn today and your tests are completely normal, you will receive your results only by: MyChart Message (if you have MyChart) OR A paper copy in the mail If you have any lab test that is abnormal or we need to change your treatment, we will call you to review the results.   Follow-Up: At [facility], you and your health needs are our priority.  As part of our continuing mission to provide you with exceptional heart care, our providers are all part of one team.  This team includes your primary Cardiologist (physician) and Advanced Practice Providers or APPs (Physician Assistants and Nurse Practitioners) who all work together to provide you with the care you need, when you need it.  Your next appointment:   1 year(s)  Provider:   Oneil Parchment, MD

## 2023-11-08 ENCOUNTER — Ambulatory Visit: Payer: Self-pay | Admitting: Physician Assistant

## 2023-11-08 ENCOUNTER — Telehealth: Payer: Self-pay | Admitting: Cardiology

## 2023-11-08 LAB — COMPREHENSIVE METABOLIC PANEL WITH GFR
ALT: 11 IU/L (ref 0–32)
AST: 19 IU/L (ref 0–40)
Albumin: 3.9 g/dL (ref 3.8–4.8)
Alkaline Phosphatase: 176 IU/L — ABNORMAL HIGH (ref 49–135)
BUN/Creatinine Ratio: 8 — ABNORMAL LOW (ref 12–28)
BUN: 7 mg/dL — ABNORMAL LOW (ref 8–27)
Bilirubin Total: 0.4 mg/dL (ref 0.0–1.2)
CO2: 22 mmol/L (ref 20–29)
Calcium: 9.7 mg/dL (ref 8.7–10.3)
Chloride: 107 mmol/L — ABNORMAL HIGH (ref 96–106)
Creatinine, Ser: 0.84 mg/dL (ref 0.57–1.00)
Globulin, Total: 1.8 g/dL (ref 1.5–4.5)
Glucose: 78 mg/dL (ref 70–99)
Potassium: 5 mmol/L (ref 3.5–5.2)
Sodium: 143 mmol/L (ref 134–144)
Total Protein: 5.7 g/dL — ABNORMAL LOW (ref 6.0–8.5)
eGFR: 73 mL/min/1.73 (ref >59)

## 2023-11-08 LAB — LIPID PANEL
Chol/HDL Ratio: 2.6 ratio (ref 0.0–4.4)
Cholesterol, Total: 102 mg/dL (ref 100–199)
HDL: 40 mg/dL (ref >39)
LDL Chol Calc (NIH): 34 mg/dL (ref 0–99)
Triglycerides: 171 mg/dL — ABNORMAL HIGH (ref 0–149)
VLDL Cholesterol Cal: 28 mg/dL (ref 5–40)

## 2023-11-08 LAB — CBC
Hematocrit: 39.8 % (ref 34.0–46.6)
Hemoglobin: 13.1 g/dL (ref 11.1–15.9)
MCH: 28.3 pg (ref 26.6–33.0)
MCHC: 32.9 g/dL (ref 31.5–35.7)
MCV: 86 fL (ref 79–97)
Platelets: 382 x10E3/uL (ref 150–450)
RBC: 4.63 x10E6/uL (ref 3.77–5.28)
RDW: 13 % (ref 11.7–15.4)
WBC: 4.6 x10E3/uL (ref 3.4–10.8)

## 2023-11-08 LAB — TSH: TSH: 1.13 u[IU]/mL (ref 0.450–4.500)

## 2023-11-08 NOTE — Telephone Encounter (Signed)
 I called the patient and she called me back.  Since she spoke with [place] Chalmers, she has had expansion of the rash to her back, arms, body, and it itches.  She has a history of allergies and also just started recently some support hose.  She got an appointment with the dermatologist which is listed.  She had no symptoms of anything dramatic at the beginning, and is unsure of the source of this hence the visit to derm.  For now we will hold Repatha  until we have greater visibility.  We will continue to discuss her situation over time and re-decide if she should resume Repatha  at a later date.

## 2023-11-10 ENCOUNTER — Ambulatory Visit (INDEPENDENT_AMBULATORY_CARE_PROVIDER_SITE_OTHER): Admitting: Dermatology

## 2023-11-10 ENCOUNTER — Encounter: Payer: Self-pay | Admitting: Dermatology

## 2023-11-10 VITALS — BP 98/63 | HR 70

## 2023-11-10 DIAGNOSIS — L570 Actinic keratosis: Secondary | ICD-10-CM | POA: Diagnosis not present

## 2023-11-10 DIAGNOSIS — W908XXA Exposure to other nonionizing radiation, initial encounter: Secondary | ICD-10-CM

## 2023-11-10 DIAGNOSIS — R21 Rash and other nonspecific skin eruption: Secondary | ICD-10-CM | POA: Diagnosis not present

## 2023-11-10 MED ORDER — HYDROXYZINE HCL 25 MG PO TABS
25.0000 mg | ORAL_TABLET | Freq: Three times a day (TID) | ORAL | 0 refills | Status: AC | PRN
Start: 2023-11-10 — End: ?

## 2023-11-10 MED ORDER — TRIAMCINOLONE ACETONIDE 0.1 % EX OINT
1.0000 | TOPICAL_OINTMENT | Freq: Two times a day (BID) | CUTANEOUS | 2 refills | Status: DC
Start: 2023-11-10 — End: 2023-11-14

## 2023-11-10 NOTE — Patient Instructions (Addendum)

## 2023-11-10 NOTE — Progress Notes (Signed)
 New Patient Visit   Subjective  Leslie Cross is a 73 y.o. female who presents for the following: Rash  Patient states she has rash located at the abdomen, upper legs, and lower legs that she would like to have examined. Patient reports the areas have been there for 2 months. She reports the areas are bothersome. Patient reports the areas are very itchy. Patient rates irritation 9 out of 10.She initial though it was related to the Repatha  injections  so she was advised by her provider to stop. Patient reports she has not previously been treated for these areas.Patient has tried otc Benadryl Spray. It helped some but much.  Patient denies Hx of bx. Patient denies family history of skin cancer(s).  The following portions of the chart were reviewed this encounter and updated as appropriate: medications, allergies, medical history  Review of Systems:  No other skin or systemic complaints except as noted in HPI or Assessment and Plan.  Objective  Well appearing patient in no apparent distress; mood and affect are within normal limits.  A full examination was performed including scalp, head, eyes, ears, nose, lips, neck, chest, axillae, abdomen, back, buttocks, bilateral upper extremities, bilateral lower extremities, hands, feet, fingers, toes, fingernails, and toenails. All findings within normal limits unless otherwise noted below.   Relevant exam findings are noted in the Assessment and Plan.       Left Temple Erythematous thin papules/macules with gritty scale.   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 Suspected drug reaction to Repatha , stopped in early September. Symptoms improving post-discontinuation, consistent with vascular-type drug reaction. - Avoid Repatha . - Prescribe hydroxyzine  at night  as needed for severe pruritus, starting with half a tablet. - Prescribe triamcinolone  cream, apply thin layer on affected areas twice daily for two weeks. - After two weeks, switch to CeraVe anti-itch cream with pramoxine, applied twice daily for two weeks. - Alternate between triamcinolone  and CeraVe anti-itch cream every two weeks until rash resolves. - Advise rash may take four to six weeks to resolve, with gradual improvement weekly. - Instruct to contact via MyChart for emergency appointment if rash not resolved in eight weeks or worsens.   ACTINIC KERATOSIS Exam: Erythematous thin papules/macules with gritty scale at the face  Actinic keratoses are precancerous spots that appear secondary to cumulative UV radiation exposure/sun exposure over time. They are chronic with expected duration over 1 year. A portion of actinic keratoses will progress to squamous cell carcinoma of the skin. It is not possible to reliably predict which spots will progress to skin cancer and so treatment is recommended to prevent development of skin cancer.  Recommend daily broad spectrum sunscreen SPF 30+ to sun-exposed areas, reapply every 2 hours as needed.  Recommend staying in the shade or wearing long sleeves, sun glasses (UVA+UVB protection) and wide brim hats (4-inch brim around the entire circumference of the hat). Call for new or changing lesions.  Treatment Plan: Cryo Therapy Completed While in office today  AK (ACTINIC KERATOSIS) Left Temple Destruction of lesion - Left Temple Complexity: simple   Destruction method: cryotherapy   Informed consent: discussed and consent obtained   Timeout:  patient name, date of birth, surgical site, and procedure verified Lesion destroyed using liquid nitrogen: Yes   Cryotherapy cycles:  1 Post-procedure details: wound care instructions given    RASH  AND OTHER NONSPECIFIC SKIN ERUPTION   Related Medications hydrOXYzine  (ATARAX ) 25 MG tablet Take 1 tablet  (25 mg total) by mouth 3 (three) times daily as needed for itching. triamcinolone  ointment (KENALOG ) 0.1 % Apply 1 Application topically in the morning and at bedtime. Apply 2 times daily for 2 weeks then STOP for 2 weeks  Return in about 16 weeks (around 03/01/2024) for TBSE & Rash Follow Up.  I, Jetta Ager, am acting as Neurosurgeon for [employer], DO.  Documentation: I have reviewed the above documentation for accuracy and completeness, and I agree with the above.  Delon Lenis, DO

## 2023-11-14 ENCOUNTER — Other Ambulatory Visit: Payer: Self-pay

## 2023-11-14 DIAGNOSIS — R21 Rash and other nonspecific skin eruption: Secondary | ICD-10-CM

## 2023-11-14 MED ORDER — MOMETASONE FUROATE 0.1 % EX CREA
1.0000 | TOPICAL_CREAM | Freq: Two times a day (BID) | CUTANEOUS | 9 refills | Status: AC
Start: 2023-11-14 — End: ?

## 2023-11-14 NOTE — Progress Notes (Signed)
 Patient LVM stating the [facility] caused her itch to become worse and she is requesting a alternative medication to be sent in. I spoke with patient. She states she is not going to take any additional oral pills due to her allergy list. I advised patient that prior to prescribing any new medications we have to verify if the medication interact with any of her current medications also confirm and verify if the medications contain anything that she is currently allergic to. Patient states it is her personal decision not to take Hydroxyzine  or Prednisone. I advised that is perfectly fine. I advised I would notifty Dr. Alm of her decision and she only wants to proceed with topicals only. Patient stated she is currently under a lot of stress due to a lawsuit and she is very cautious about putting medication in her body.   I voiced understanding and verified the correct pharmacy to sedn her mometasone to/

## 2024-01-05 ENCOUNTER — Other Ambulatory Visit (HOSPITAL_COMMUNITY): Payer: Self-pay

## 2024-01-05 ENCOUNTER — Other Ambulatory Visit: Payer: Self-pay | Admitting: Cardiology

## 2024-01-05 DIAGNOSIS — E785 Hyperlipidemia, unspecified: Secondary | ICD-10-CM

## 2024-01-05 DIAGNOSIS — I2510 Atherosclerotic heart disease of native coronary artery without angina pectoris: Secondary | ICD-10-CM

## 2024-01-06 ENCOUNTER — Other Ambulatory Visit: Payer: Self-pay

## 2024-01-06 MED ORDER — REPATHA SURECLICK 140 MG/ML ~~LOC~~ SOAJ
140.0000 mg | SUBCUTANEOUS | 3 refills | Status: AC
  Filled 2024-01-06: qty 6, 84d supply, fill #0
  Filled 2024-01-09: qty 12, 168d supply, fill #0
  Filled 2024-01-11: qty 6, 84d supply, fill #0
  Filled 2024-03-06: qty 12, 168d supply, fill #0

## 2024-01-09 ENCOUNTER — Other Ambulatory Visit: Payer: Self-pay

## 2024-01-11 ENCOUNTER — Other Ambulatory Visit: Payer: Self-pay

## 2024-01-11 ENCOUNTER — Other Ambulatory Visit (HOSPITAL_COMMUNITY): Payer: Self-pay

## 2024-01-13 ENCOUNTER — Other Ambulatory Visit (HOSPITAL_COMMUNITY): Payer: Self-pay

## 2024-01-16 ENCOUNTER — Telehealth: Payer: Self-pay | Admitting: Cardiology

## 2024-01-16 NOTE — Telephone Encounter (Signed)
*  STAT* If patient is at the pharmacy, call can be transferred to refill team.   1. Which medications need to be refilled? (please list name of each medication and dose if known) clopidogrel  (PLAVIX ) 75 MG tablet    2. Would you like to learn more about the convenience, safety, & potential cost savings by using the [facility]?    3. Are you open to using the [facility] (Type [facility]. ).   4. Which pharmacy/location (including street and city if local pharmacy) is medication to be sent to? [facility] [address] [place] - [address]    5. Do they need a 30 day or 90 day supply? 90 day

## 2024-01-17 MED ORDER — CLOPIDOGREL BISULFATE 75 MG PO TABS: 75.0000 mg | ORAL_TABLET | Freq: Every day | ORAL | 2 refills | Status: AC

## 2024-01-17 NOTE — Telephone Encounter (Signed)
 Refill sent

## 2024-01-26 ENCOUNTER — Encounter: Payer: Self-pay | Admitting: *Deleted

## 2024-01-26 DIAGNOSIS — Z006 Encounter for examination for normal comparison and control in clinical research program: Secondary | ICD-10-CM

## 2024-01-26 NOTE — Research (Signed)
 Week 120 Follow-Up Visit Completed*   []  Not Necessary, No Potential Adverse Events Or Medication Issues Reported On Completed Subject Questionnaire   [x]  Yes, Contact With Subject/Alternate Contact Completed   []  Yes, No Contact With Subject/Alternate Contact Completed, But Electronic Health Record Was Reviewed   []  No, Unable To Contact Subject/Alternate Contact   Have you reviewed Ongoing medications on the Targeted Concomitant Medication form and updated the form as needed?   [x]  Yes   []  No   Subject Status*   [x]  Continuing In Follow-up   []  At Risk For Lost To Follow-up   []  Withdrawal From All Future Study Activities Including Passive Follow-up By Electronic Health Record Review Or Contact With Healthcare Provider Or Family Member/Friend   []  Death   Vital Status*   [x]  Alive   []  Deceased   []  Unknown   Last Known To Be Alive Source*   [x]  Subject Completed Follow-up Questionnaire/Seen In Person/Via Telephone Contact   []  Family Member or Caretaker   []  Primary Physician Or Medical Records   []  Publicly Available Source   []  Other  Date of last dose taken   02/Dec/2025  Over the last 12 weeks did the subject miss any doses? 4  Over the last 12 weeks did the subject restart Evolocumab  after an interruption? Yes  Patient was taken off drug due to rash and restarted on her own.

## 2024-03-06 ENCOUNTER — Other Ambulatory Visit (HOSPITAL_COMMUNITY): Payer: Self-pay

## 2024-03-06 ENCOUNTER — Other Ambulatory Visit: Payer: Self-pay

## 2024-03-08 ENCOUNTER — Other Ambulatory Visit: Payer: Self-pay

## 2024-03-12 ENCOUNTER — Other Ambulatory Visit (HOSPITAL_COMMUNITY): Payer: Self-pay

## 2024-04-09 ENCOUNTER — Ambulatory Visit: Admitting: Dermatology
